# Patient Record
Sex: Female | Born: 1965 | Race: White | Hispanic: No | Marital: Married | Smoking: Never smoker
Health system: Southern US, Community
[De-identification: ages and names within clinical notes are randomized; demographics above are authoritative.]

## PROBLEM LIST (undated history)

## (undated) DIAGNOSIS — H8110 Benign paroxysmal vertigo, unspecified ear: Secondary | ICD-10-CM

## (undated) DIAGNOSIS — I10 Essential (primary) hypertension: Secondary | ICD-10-CM

## (undated) DIAGNOSIS — M7702 Medial epicondylitis, left elbow: Secondary | ICD-10-CM

## (undated) DIAGNOSIS — H3530 Unspecified macular degeneration: Secondary | ICD-10-CM

## (undated) DIAGNOSIS — N200 Calculus of kidney: Secondary | ICD-10-CM

## (undated) DIAGNOSIS — M1990 Unspecified osteoarthritis, unspecified site: Secondary | ICD-10-CM

## (undated) DIAGNOSIS — F419 Anxiety disorder, unspecified: Secondary | ICD-10-CM

## (undated) DIAGNOSIS — F329 Major depressive disorder, single episode, unspecified: Secondary | ICD-10-CM

## (undated) DIAGNOSIS — G5761 Lesion of plantar nerve, right lower limb: Secondary | ICD-10-CM

## (undated) DIAGNOSIS — N959 Unspecified menopausal and perimenopausal disorder: Secondary | ICD-10-CM

## (undated) DIAGNOSIS — E785 Hyperlipidemia, unspecified: Secondary | ICD-10-CM

## (undated) DIAGNOSIS — G4730 Sleep apnea, unspecified: Secondary | ICD-10-CM

## (undated) HISTORY — PX: ESOPHAGOGASTRODUODENOSCOPY: SHX1529

## (undated) HISTORY — DX: Unspecified osteoarthritis, unspecified site: M19.90

## (undated) HISTORY — DX: Anxiety disorder, unspecified: F41.9

## (undated) HISTORY — DX: Calculus of kidney: N20.0

## (undated) HISTORY — DX: Major depressive disorder, single episode, unspecified: F32.9

## (undated) HISTORY — DX: Unspecified menopausal and perimenopausal disorder: N95.9

## (undated) HISTORY — DX: Medial epicondylitis, left elbow: M77.02

## (undated) HISTORY — DX: Essential (primary) hypertension: I10

## (undated) HISTORY — DX: Benign paroxysmal vertigo, unspecified ear: H81.10

## (undated) HISTORY — DX: Lesion of plantar nerve, right lower limb: G57.61

## (undated) HISTORY — DX: Hyperlipidemia, unspecified: E78.5

---

## 2013-03-01 ENCOUNTER — Ambulatory Visit: Payer: Self-pay | Admitting: Family Medicine

## 2014-05-23 ENCOUNTER — Ambulatory Visit: Payer: Self-pay | Admitting: Family Medicine

## 2015-06-04 ENCOUNTER — Other Ambulatory Visit: Payer: Self-pay | Admitting: Family Medicine

## 2015-06-04 DIAGNOSIS — Z1231 Encounter for screening mammogram for malignant neoplasm of breast: Secondary | ICD-10-CM

## 2015-06-06 ENCOUNTER — Ambulatory Visit
Admission: RE | Admit: 2015-06-06 | Discharge: 2015-06-06 | Disposition: A | Discharge status: Home / Self Care | Payer: No Typology Code available for payment source | Source: Ambulatory Visit | Attending: Family Medicine | Admitting: Family Medicine

## 2015-06-06 DIAGNOSIS — Z1231 Encounter for screening mammogram for malignant neoplasm of breast: Secondary | ICD-10-CM | POA: Insufficient documentation

## 2015-11-13 ENCOUNTER — Ambulatory Visit (INDEPENDENT_AMBULATORY_CARE_PROVIDER_SITE_OTHER): Payer: BLUE CROSS/BLUE SHIELD | Admitting: Family Medicine

## 2015-11-13 ENCOUNTER — Encounter: Payer: Self-pay | Admitting: Family Medicine

## 2015-11-13 VITALS — BP 127/87 | HR 77 | Temp 98.0°F | Ht 58.5 in | Wt 169.0 lb

## 2015-11-13 DIAGNOSIS — M7700 Medial epicondylitis, unspecified elbow: Secondary | ICD-10-CM | POA: Diagnosis not present

## 2015-11-13 DIAGNOSIS — J0100 Acute maxillary sinusitis, unspecified: Secondary | ICD-10-CM | POA: Diagnosis not present

## 2015-11-13 DIAGNOSIS — M7702 Medial epicondylitis, left elbow: Secondary | ICD-10-CM

## 2015-11-13 DIAGNOSIS — E785 Hyperlipidemia, unspecified: Secondary | ICD-10-CM

## 2015-11-13 DIAGNOSIS — E1169 Type 2 diabetes mellitus with other specified complication: Secondary | ICD-10-CM | POA: Insufficient documentation

## 2015-11-13 DIAGNOSIS — E1159 Type 2 diabetes mellitus with other circulatory complications: Secondary | ICD-10-CM | POA: Insufficient documentation

## 2015-11-13 DIAGNOSIS — Z5181 Encounter for therapeutic drug level monitoring: Secondary | ICD-10-CM

## 2015-11-13 DIAGNOSIS — I152 Hypertension secondary to endocrine disorders: Secondary | ICD-10-CM | POA: Insufficient documentation

## 2015-11-13 DIAGNOSIS — I10 Essential (primary) hypertension: Secondary | ICD-10-CM

## 2015-11-13 MED ORDER — FLUCONAZOLE 150 MG PO TABS: 150.0000 mg | ORAL_TABLET | Freq: Once | ORAL | Status: DC

## 2015-11-13 MED ORDER — AMOXICILLIN-POT CLAVULANATE 875-125 MG PO TABS
1.0000 | ORAL_TABLET | Freq: Two times a day (BID) | ORAL | Status: AC
Start: 2015-11-13 — End: 2015-11-22

## 2015-11-13 NOTE — Assessment & Plan Note (Signed)
Check lipids 

## 2015-11-13 NOTE — Assessment & Plan Note (Signed)
Open invitation so she can call for a referral to ortho if needed; may use NSAID per package directions

## 2015-11-13 NOTE — Assessment & Plan Note (Signed)
Offered to let her try to get through this without antibiotics, but she has been sick long enough (a few weeks), ready to try antibiotics; discussed risk of C diff; also gave fluconazole in case needed; see AVS

## 2015-11-13 NOTE — Progress Notes (Signed)
BP 127/87 mmHg  Pulse 77  Temp(Src) 98 F (36.7 C)  Ht 4' 10.5" (1.486 m)  Wt 169 lb (76.658 kg)  BMI 34.72 kg/m2  SpO2 97%   Subjective:    Patient ID: Marissa Baldwin, female    DOB: May 31, 1966, 50 y.o.   MRN: [id number]  HPI: Marissa Baldwin is a 50 y.o. female  Chief Complaint  Patient presents with  . Establish Care  . Sinusitis    she saw urgent care a few weeks ago and got an antibiotic, but feels like it's not resolved.    She thinks has a sinus infection; started at the end of December; not sure; she went to urgent care last summer, he gave her an antibiotic but that in the summer; had a fever a few weeks ago; no fever now; ears are like water in them; no rash, no travel, no sore throat; blew out yellowish stuff from her nose  She had left elbow tendonitis, runs down the arm; saw Dr. Dema Severin; going on for 3 months; sometimes she doesn't even feel, but sometimes it wakes her up at night; sore to the touch; not interested in seeing specialist right now; trying NSAID and ice  Using venlafaxine for hot flashes; last period 3 years ago; had a complete physical in the summer at another clinic  High cholesterol, has not been checked for a while; last check 6-8 months ago  Has high blood pressure; on two medicines  Relevant past medical, surgical, family and social history reviewed and updated as indicated. Interim medical history since our last visit reviewed. Allergies and medications reviewed and updated.  Review of Systems Per HPI unless specifically indicated above     Objective:    BP 127/87 mmHg  Pulse 77  Temp(Src) 98 F (36.7 C)  Ht 4' 10.5" (1.486 m)  Wt 169 lb (76.658 kg)  BMI 34.72 kg/m2  SpO2 97%  Wt Readings from Last 3 Encounters:  11/13/15 169 lb (76.658 kg)    Physical Exam  Constitutional: She appears well-developed and well-nourished. No distress.  HENT:  Head: Normocephalic and atraumatic.  Right Ear: Hearing, tympanic membrane, external ear and ear  canal normal. Tympanic membrane is not erythematous. No middle ear effusion.  Left Ear: Hearing, tympanic membrane, external ear and ear canal normal. Tympanic membrane is not erythematous.  No middle ear effusion.  Nose: Mucosal edema and rhinorrhea (yellowish drainage, right > left) present.  Mouth/Throat: Mucous membranes are normal. Posterior oropharyngeal erythema: mild injection posteriorly.  Eyes: EOM are normal. No scleral icterus.  Neck: No thyromegaly present.  Cardiovascular: Normal rate, regular rhythm and normal heart sounds.   No murmur heard. Pulmonary/Chest: Effort normal and breath sounds normal. No respiratory distress. She has no wheezes.  Abdominal: Soft. Bowel sounds are normal. She exhibits no distension.  Musculoskeletal: Normal range of motion. She exhibits no edema.       Left forearm: She exhibits tenderness (mild over medial epicondyle). She exhibits no swelling, no edema and no deformity.  Lymphadenopathy:    She has no cervical adenopathy.  Neurological: She is alert. She exhibits normal muscle tone.  Strength in UE 5/5 except for slight breakthrough with thumb to pinky on the left  Skin: Skin is warm and dry. No rash noted. She is not diaphoretic. No pallor.  Psychiatric: She has a normal mood and affect. Her behavior is normal. Judgment and thought content normal.    No results found for this or any previous  visit.    Assessment & Plan:   Problem List Items Addressed This Visit      Cardiovascular and Mediastinum   Essential hypertension, benign    Continue regimen; try DASH guidelines; see AVS; check creatinine      Relevant Medications   amLODipine (NORVASC) 5 MG tablet   losartan (COZAAR) 50 MG tablet     Respiratory   Acute maxillary sinusitis - Primary    Offered to let her try to get through this without antibiotics, but she has been sick long enough (a few weeks), ready to try antibiotics; discussed risk of C diff; also gave fluconazole in  case needed; see AVS      Relevant Medications   amoxicillin-clavulanate (AUGMENTIN) 875-125 MG tablet   fluconazole (DIFLUCAN) 150 MG tablet     Musculoskeletal and Integument   Golfers elbow of left upper extremity    Open invitation so she can call for a referral to ortho if needed; may use NSAID per package directions      Relevant Medications   acetaminophen (TYLENOL) 325 MG tablet   naproxen sodium (RA NAPROXEN SODIUM) 220 MG tablet     Other   Hyperlipidemia    Check lipids      Relevant Medications   amLODipine (NORVASC) 5 MG tablet   losartan (COZAAR) 50 MG tablet   Other Relevant Orders   Lipid Panel w/o Chol/HDL Ratio   Medication monitoring encounter    Check CBC and CMP on current meds      Relevant Orders   Comprehensive metabolic panel   CBC with Differential/Platelet       Follow up plan: Return in about 6 months (around 05/12/2016) for complete physical.  Orders Placed This Encounter  Procedures  . Lipid Panel w/o Chol/HDL Ratio  . Comprehensive metabolic panel  . CBC with Differential/Platelet   Meds ordered this encounter  Medications  . acetaminophen (TYLENOL) 325 MG tablet    Sig: Take 650 mg by mouth every 4 (four) hours as needed.  [name] Kitchen amLODipine (NORVASC) 5 MG tablet    Sig: Take 5 mg by mouth daily.  [name] Kitchen losartan (COZAAR) 50 MG tablet    Sig: Take 50 mg by mouth daily.  . meclizine (ANTIVERT) 25 MG tablet    Sig: Take 25 mg by mouth 3 (three) times daily as needed.  . naproxen sodium (RA NAPROXEN SODIUM) 220 MG tablet    Sig: Take 220 mg by mouth 2 (two) times daily.  [name] Kitchen venlafaxine XR (EFFEXOR-XR) 75 MG 24 hr capsule    Sig: Take 75 mg by mouth daily.  [name] Kitchen amoxicillin-clavulanate (AUGMENTIN) 875-125 MG tablet    Sig: Take 1 tablet by mouth 2 (two) times daily.    Dispense:  20 tablet    Refill:  0  . fluconazole (DIFLUCAN) 150 MG tablet    Sig: Take 1 tablet (150 mg total) by mouth once.    Dispense:  1 tablet    Refill:  0   An  after-visit summary was printed and given to the patient at [place].  Please see the patient instructions which may contain other information and recommendations beyond what is mentioned above in the assessment and plan.

## 2015-11-13 NOTE — Assessment & Plan Note (Signed)
Check CBC and CMP on current meds

## 2015-11-13 NOTE — Assessment & Plan Note (Signed)
Continue regimen; try DASH guidelines; see AVS; check creatinine

## 2015-11-13 NOTE — Patient Instructions (Addendum)
Please do eat yogurt daily or take a probiotic daily for the next month or two We want to replace the healthy germs in the gut If you notice foul, watery diarrhea in the next two months, schedule an appointment RIGHT AWAY Start the antibiotics Use the fluconazole if needed If your elbow becomes too bothersome, let me know and we'll refer you to an orthopaedist  Your goal blood pressure is less than 140 mmHg on top and under 90 on the bottom Try to follow the DASH guidelines (DASH stands for Dietary Approaches to Stop Hypertension) Try to limit the sodium in your diet.  Ideally, consume less than 1.5 grams (less than 1,500mg ) per day. Do not add salt when cooking or at the table.  Check the sodium amount on labels when shopping, and choose items lower in sodium when given a choice. Avoid or limit foods that already contain a lot of sodium. Eat a diet rich in fruits and vegetables and whole grains.    DASH Eating Plan DASH stands for "Dietary Approaches to Stop Hypertension." The DASH eating plan is a healthy eating plan that has been shown to reduce high blood pressure (hypertension). Additional health benefits may include reducing the risk of type 2 diabetes mellitus, heart disease, and stroke. The DASH eating plan may also help with weight loss. WHAT DO I NEED TO KNOW ABOUT THE DASH EATING PLAN? For the DASH eating plan, you will follow these general guidelines:  Choose foods with a percent daily value for sodium of less than 5% (as listed on the food label).  Use salt-free seasonings or herbs instead of table salt or sea salt.  Check with your health care provider or pharmacist before using salt substitutes.  Eat lower-sodium products, often labeled as "lower sodium" or "no salt added."  Eat fresh foods.  Eat more vegetables, fruits, and low-fat dairy products.  Choose whole grains. Look for the word "whole" as the first word in the ingredient list.  Choose fish and skinless  chicken or [place] more often than red meat. Limit fish, poultry, and meat to 6 oz (170 g) each day.  Limit sweets, desserts, sugars, and sugary drinks.  Choose heart-healthy fats.  Limit cheese to 1 oz (28 g) per day.  Eat more home-cooked food and less restaurant, buffet, and fast food.  Limit fried foods.  Cook foods using methods other than frying.  Limit canned vegetables. If you do use them, rinse them well to decrease the sodium.  When eating at a restaurant, ask that your food be prepared with less salt, or no salt if possible. WHAT FOODS CAN I EAT? Seek help from a dietitian for individual calorie needs. Grains Whole grain or whole wheat bread. Brown rice. Whole grain or whole wheat pasta. Quinoa, bulgur, and whole grain cereals. Low-sodium cereals. Corn or whole wheat flour tortillas. Whole grain cornbread. Whole grain crackers. Low-sodium crackers. Vegetables Fresh or frozen vegetables (raw, steamed, roasted, or grilled). Low-sodium or reduced-sodium tomato and vegetable juices. Low-sodium or reduced-sodium tomato sauce and paste. Low-sodium or reduced-sodium canned vegetables.  Fruits All fresh, canned (in natural juice), or frozen fruits. Meat and Other Protein Products Ground beef (85% or leaner), grass-fed beef, or beef trimmed of fat. Skinless chicken or [place]. Ground chicken or [place]. Pork trimmed of fat. All fish and seafood. Eggs. Dried beans, peas, or lentils. Unsalted nuts and seeds. Unsalted canned beans. Dairy Low-fat dairy products, such as skim or 1% milk, 2% or reduced-fat cheeses, low-fat  ricotta or cottage cheese, or plain low-fat yogurt. Low-sodium or reduced-sodium cheeses. Fats and Oils Tub margarines without trans fats. Light or reduced-fat mayonnaise and salad dressings (reduced sodium). Avocado. Safflower, olive, or canola oils. Natural peanut or almond butter. Other Unsalted popcorn and pretzels. The items listed above may not be a complete list of  recommended foods or beverages. Contact your dietitian for more options. WHAT FOODS ARE NOT RECOMMENDED? Grains White bread. White pasta. White rice. Refined cornbread. Bagels and croissants. Crackers that contain trans fat. Vegetables Creamed or fried vegetables. Vegetables in a cheese sauce. Regular canned vegetables. Regular canned tomato sauce and paste. Regular tomato and vegetable juices. Fruits Dried fruits. Canned fruit in light or heavy syrup. Fruit juice. Meat and Other Protein Products Fatty cuts of meat. Ribs, chicken wings, bacon, sausage, bologna, salami, chitterlings, fatback, hot dogs, bratwurst, and packaged luncheon meats. Salted nuts and seeds. Canned beans with salt. Dairy Whole or 2% milk, cream, half-and-half, and cream cheese. Whole-fat or sweetened yogurt. Full-fat cheeses or blue cheese. Nondairy creamers and whipped toppings. Processed cheese, cheese spreads, or cheese curds. Condiments Onion and garlic salt, seasoned salt, table salt, and sea salt. Canned and packaged gravies. Worcestershire sauce. Tartar sauce. Barbecue sauce. Teriyaki sauce. Soy sauce, including reduced sodium. Steak sauce. Fish sauce. Oyster sauce. Cocktail sauce. Horseradish. Ketchup and mustard. Meat flavorings and tenderizers. Bouillon cubes. Hot sauce. Tabasco sauce. Marinades. Taco seasonings. Relishes. Fats and Oils Butter, stick margarine, lard, shortening, ghee, and bacon fat. Coconut, palm kernel, or palm oils. Regular salad dressings. Other Pickles and olives. Salted popcorn and pretzels. The items listed above may not be a complete list of foods and beverages to avoid. Contact your dietitian for more information. WHERE CAN I FIND MORE INFORMATION? National Heart, Lung, and Blood Institute: travelstabloid.com   This information is not intended to replace advice given to you by your health care provider. Make sure you discuss any questions you have with  your health care provider.   Document Released: 09/24/2011 Document Revised: 10/26/2014 Document Reviewed: 08/09/2013 Elsevier Interactive Patient Education 2016 [employer]. Sinusitis, Adult Sinusitis is redness, soreness, and puffiness (inflammation) of the air pockets in the bones of your face (sinuses). The redness, soreness, and puffiness can cause air and mucus to get trapped in your sinuses. This can allow germs to grow and cause an infection.  HOME CARE   Drink enough fluids to keep your pee (urine) clear or pale yellow.  Use a humidifier in your home.  Run a hot shower to create steam in the bathroom. Sit in the bathroom with the door closed. Breathe in the steam 3-4 times a day.  Put a warm, moist washcloth on your face 3-4 times a day, or as told by your doctor.  Use salt water sprays (saline sprays) to wet the thick fluid in your nose. This can help the sinuses drain.  Only take medicine as told by your doctor. GET HELP RIGHT AWAY IF:   Your pain gets worse.  You have very bad headaches.  You are sick to your stomach (nauseous).  You throw up (vomit).  You are very sleepy (drowsy) all the time.  Your face is puffy (swollen).  Your vision changes.  You have a stiff neck.  You have trouble breathing. MAKE SURE YOU:   Understand these instructions.  Will watch your condition.  Will get help right away if you are not doing well or get worse.   This information is not intended to  replace advice given to you by your health care provider. Make sure you discuss any questions you have with your health care provider.   Document Released: 03/23/2008 Document Revised: 10/26/2014 Document Reviewed: 05/10/2012 Elsevier Interactive Patient Education [employer].

## 2015-11-14 LAB — CBC WITH DIFFERENTIAL/PLATELET
Basophils Absolute: 0 10*3/uL (ref 0.0–0.2)
Basos: 1 %
EOS (ABSOLUTE): 0 10*3/uL (ref 0.0–0.4)
EOS: 0 %
Hematocrit: 43.8 % (ref 34.0–46.6)
Hemoglobin: 14.5 g/dL (ref 11.1–15.9)
IMMATURE GRANULOCYTES: 0 %
Immature Grans (Abs): 0 10*3/uL (ref 0.0–0.1)
Lymphocytes Absolute: 2 10*3/uL (ref 0.7–3.1)
Lymphs: 33 %
MCH: 29.1 pg (ref 26.6–33.0)
MCHC: 33.1 g/dL (ref 31.5–35.7)
MCV: 88 fL (ref 79–97)
MONOS ABS: 0.3 10*3/uL (ref 0.1–0.9)
Monocytes: 5 %
NEUTROS PCT: 61 %
Neutrophils Absolute: 3.8 10*3/uL (ref 1.4–7.0)
PLATELETS: 394 10*3/uL — AB (ref 150–379)
RBC: 4.99 x10E6/uL (ref 3.77–5.28)
RDW: 13.8 % (ref 12.3–15.4)
WBC: 6.1 10*3/uL (ref 3.4–10.8)

## 2015-11-14 LAB — COMPREHENSIVE METABOLIC PANEL
ALK PHOS: 135 IU/L — AB (ref 39–117)
ALT: 67 IU/L — AB (ref 0–32)
AST: 42 IU/L — AB (ref 0–40)
Albumin/Globulin Ratio: 1.7 (ref 1.1–2.5)
Albumin: 4.8 g/dL (ref 3.5–5.5)
BUN/Creatinine Ratio: 18 (ref 9–23)
BUN: 12 mg/dL (ref 6–24)
Bilirubin Total: 0.4 mg/dL (ref 0.0–1.2)
CO2: 23 mmol/L (ref 18–29)
CREATININE: 0.67 mg/dL (ref 0.57–1.00)
Calcium: 10 mg/dL (ref 8.7–10.2)
Chloride: 98 mmol/L (ref 96–106)
GFR calc Af Amer: 119 mL/min/{1.73_m2} (ref >59)
GFR calc non Af Amer: 104 mL/min/{1.73_m2} (ref >59)
GLOBULIN, TOTAL: 2.8 g/dL (ref 1.5–4.5)
Glucose: 110 mg/dL — ABNORMAL HIGH (ref 65–99)
POTASSIUM: 4.6 mmol/L (ref 3.5–5.2)
SODIUM: 139 mmol/L (ref 134–144)
Total Protein: 7.6 g/dL (ref 6.0–8.5)

## 2015-11-14 LAB — LIPID PANEL W/O CHOL/HDL RATIO
Cholesterol, Total: 249 mg/dL — ABNORMAL HIGH (ref 100–199)
HDL: 59 mg/dL (ref >39)
LDL CALC: 170 mg/dL — AB (ref 0–99)
Triglycerides: 98 mg/dL (ref 0–149)
VLDL CHOLESTEROL CAL: 20 mg/dL (ref 5–40)

## 2015-11-16 ENCOUNTER — Telehealth: Payer: Self-pay | Admitting: Family Medicine

## 2015-11-16 DIAGNOSIS — R7401 Elevation of levels of liver transaminase levels: Secondary | ICD-10-CM

## 2015-11-16 DIAGNOSIS — E785 Hyperlipidemia, unspecified: Secondary | ICD-10-CM

## 2015-11-16 DIAGNOSIS — R748 Abnormal levels of other serum enzymes: Secondary | ICD-10-CM | POA: Insufficient documentation

## 2015-11-16 DIAGNOSIS — R740 Nonspecific elevation of levels of transaminase and lactic acid dehydrogenase [LDH]: Secondary | ICD-10-CM

## 2015-11-16 DIAGNOSIS — D473 Essential (hemorrhagic) thrombocythemia: Secondary | ICD-10-CM

## 2015-11-16 DIAGNOSIS — D75839 Thrombocytosis, unspecified: Secondary | ICD-10-CM

## 2015-11-16 NOTE — Telephone Encounter (Signed)
Reached voicemail, left brief msg, calling about labs, don't be alarmed that I'm calling a Saturday, just my chance to catch up on calls, will call on Monday

## 2015-11-18 NOTE — Telephone Encounter (Signed)
Lab notified to add on. 

## 2015-11-18 NOTE — Assessment & Plan Note (Signed)
Consider Welchol or Zetia after checking on liver; encouraged lowfat diet, low sat fats

## 2015-11-18 NOTE — Assessment & Plan Note (Signed)
Discussed with patient, order liver US; Thursday or first thing in the AM

## 2015-11-18 NOTE — Telephone Encounter (Signed)
Discussed labs with patient; she cannot tolerate statins; will consider welchol or zetia after liver testing Explained elevated liver tests; check additional labs and get liver US; return in one month for recheck labs (hepatic function panel) ------------------------ Jossie -- please run ADD-ON labs for patient, labs drawn on the 25th

## 2015-11-20 LAB — SPECIMEN STATUS REPORT

## 2015-11-20 LAB — HEPATITIS PANEL, ACUTE
HEP B S AG: NEGATIVE
Hep A IgM: NEGATIVE
Hep B C IgM: NEGATIVE
Hep C Virus Ab: <0.1 s/co ratio (ref 0.0–0.9)

## 2015-11-20 LAB — GAMMA GT: GGT: 180 IU/L — ABNORMAL HIGH (ref 0–60)

## 2015-11-21 ENCOUNTER — Other Ambulatory Visit: Payer: Self-pay | Admitting: Family Medicine

## 2015-11-21 DIAGNOSIS — R740 Nonspecific elevation of levels of transaminase and lactic acid dehydrogenase [LDH]: Secondary | ICD-10-CM

## 2015-11-21 DIAGNOSIS — R7401 Elevation of levels of liver transaminase levels: Secondary | ICD-10-CM

## 2015-11-21 DIAGNOSIS — R748 Abnormal levels of other serum enzymes: Secondary | ICD-10-CM

## 2015-11-26 ENCOUNTER — Ambulatory Visit
Admission: RE | Admit: 2015-11-26 | Discharge: 2015-11-26 | Disposition: A | Discharge status: Home / Self Care | Payer: BLUE CROSS/BLUE SHIELD | Source: Ambulatory Visit | Attending: Family Medicine | Admitting: Family Medicine

## 2015-11-26 ENCOUNTER — Telehealth: Payer: Self-pay | Admitting: Family Medicine

## 2015-11-26 DIAGNOSIS — R748 Abnormal levels of other serum enzymes: Secondary | ICD-10-CM

## 2015-11-26 DIAGNOSIS — R7401 Elevation of levels of liver transaminase levels: Secondary | ICD-10-CM

## 2015-11-26 DIAGNOSIS — R740 Nonspecific elevation of levels of transaminase and lactic acid dehydrogenase [LDH]: Secondary | ICD-10-CM | POA: Insufficient documentation

## 2015-11-26 DIAGNOSIS — K760 Fatty (change of) liver, not elsewhere classified: Secondary | ICD-10-CM | POA: Diagnosis not present

## 2015-11-26 NOTE — Telephone Encounter (Signed)
I talked with patient about labs and [place] She was taking tylenol, but just two a day She does drink alcohol, not every day, not constantly; not even 7 drinks a week, 3 per week is more like it We talked about fatty liver, low fat diet; recheck labs next week She'll come in on February 14th at 8:15 and we'll do in-house SGOT and SGPT and send out hepatic function panel Avoid vinegar diet; briefly discussed; she has hx of ulcer

## 2015-11-26 NOTE — Assessment & Plan Note (Signed)
Recheck next week

## 2015-11-26 NOTE — Assessment & Plan Note (Signed)
Recheck labs; liver US showed fatty liver

## 2015-12-03 ENCOUNTER — Other Ambulatory Visit: Payer: BLUE CROSS/BLUE SHIELD

## 2015-12-03 DIAGNOSIS — R740 Nonspecific elevation of levels of transaminase and lactic acid dehydrogenase [LDH]: Principal | ICD-10-CM

## 2015-12-03 DIAGNOSIS — R7401 Elevation of levels of liver transaminase levels: Secondary | ICD-10-CM

## 2015-12-03 LAB — AST (SGOT) PICCOLO, WAIVED: AST (SGOT) PICCOLO, WAIVED: 35 U/L (ref 11–38)

## 2015-12-03 LAB — ALT (SGPT) PICCOLO, WAIVED: ALT (SGPT) Piccolo, Waived: 40 U/L (ref 10–47)

## 2015-12-04 LAB — HEPATIC FUNCTION PANEL
ALT: 36 IU/L — AB (ref 0–32)
AST: 27 IU/L (ref 0–40)
Albumin: 4.3 g/dL (ref 3.5–5.5)
Alkaline Phosphatase: 121 IU/L — ABNORMAL HIGH (ref 39–117)
BILIRUBIN TOTAL: 0.5 mg/dL (ref 0.0–1.2)
BILIRUBIN, DIRECT: 0.12 mg/dL (ref 0.00–0.40)
Total Protein: 6.8 g/dL (ref 6.0–8.5)

## 2015-12-11 ENCOUNTER — Other Ambulatory Visit: Payer: Self-pay | Admitting: Family Medicine

## 2015-12-12 MED ORDER — FLUCONAZOLE 150 MG PO TABS: 150.0000 mg | ORAL_TABLET | Freq: Once | ORAL | Status: DC

## 2016-01-22 DIAGNOSIS — H353131 Nonexudative age-related macular degeneration, bilateral, early dry stage: Secondary | ICD-10-CM | POA: Diagnosis not present

## 2016-04-13 ENCOUNTER — Encounter: Payer: Self-pay | Admitting: Family Medicine

## 2016-05-14 ENCOUNTER — Ambulatory Visit (INDEPENDENT_AMBULATORY_CARE_PROVIDER_SITE_OTHER): Payer: BLUE CROSS/BLUE SHIELD | Admitting: Family Medicine

## 2016-05-14 ENCOUNTER — Encounter: Payer: Self-pay | Admitting: Family Medicine

## 2016-05-14 DIAGNOSIS — E669 Obesity, unspecified: Secondary | ICD-10-CM | POA: Diagnosis not present

## 2016-05-14 DIAGNOSIS — R0681 Apnea, not elsewhere classified: Secondary | ICD-10-CM | POA: Diagnosis not present

## 2016-05-14 DIAGNOSIS — E785 Hyperlipidemia, unspecified: Secondary | ICD-10-CM

## 2016-05-14 DIAGNOSIS — G4730 Sleep apnea, unspecified: Secondary | ICD-10-CM | POA: Insufficient documentation

## 2016-05-14 DIAGNOSIS — D473 Essential (hemorrhagic) thrombocythemia: Secondary | ICD-10-CM

## 2016-05-14 DIAGNOSIS — I10 Essential (primary) hypertension: Secondary | ICD-10-CM | POA: Diagnosis not present

## 2016-05-14 DIAGNOSIS — D75839 Thrombocytosis, unspecified: Secondary | ICD-10-CM

## 2016-05-14 DIAGNOSIS — Z5181 Encounter for therapeutic drug level monitoring: Secondary | ICD-10-CM | POA: Diagnosis not present

## 2016-05-14 LAB — COMPREHENSIVE METABOLIC PANEL
ALK PHOS: 149 U/L — AB (ref 33–130)
ALT: 93 U/L — AB (ref 6–29)
AST: 61 U/L — ABNORMAL HIGH (ref 10–35)
Albumin: 4.5 g/dL (ref 3.6–5.1)
BUN: 18 mg/dL (ref 7–25)
CALCIUM: 9.6 mg/dL (ref 8.6–10.4)
CHLORIDE: 104 mmol/L (ref 98–110)
CO2: 23 mmol/L (ref 20–31)
Creat: 0.62 mg/dL (ref 0.50–1.05)
GLUCOSE: 179 mg/dL — AB (ref 65–99)
POTASSIUM: 4.2 mmol/L (ref 3.5–5.3)
Sodium: 140 mmol/L (ref 135–146)
TOTAL PROTEIN: 7.5 g/dL (ref 6.1–8.1)
Total Bilirubin: 0.6 mg/dL (ref 0.2–1.2)

## 2016-05-14 LAB — CBC WITH DIFFERENTIAL/PLATELET
BASOS ABS: 0 {cells}/uL (ref 0–200)
BASOS PCT: 0 %
EOS ABS: 0 {cells}/uL — AB (ref 15–500)
Eosinophils Relative: 0 %
HEMATOCRIT: 42.6 % (ref 35.0–45.0)
Hemoglobin: 14.5 g/dL (ref 11.7–15.5)
LYMPHS PCT: 30 %
Lymphs Abs: 1500 cells/uL (ref 850–3900)
MCH: 30 pg (ref 27.0–33.0)
MCHC: 34 g/dL (ref 32.0–36.0)
MCV: 88.2 fL (ref 80.0–100.0)
MONO ABS: 300 {cells}/uL (ref 200–950)
MPV: 10.5 fL (ref 7.5–12.5)
Monocytes Relative: 6 %
Neutro Abs: 3200 cells/uL (ref 1500–7800)
Neutrophils Relative %: 64 %
Platelets: 333 10*3/uL (ref 140–400)
RBC: 4.83 MIL/uL (ref 3.80–5.10)
RDW: 13.3 % (ref 11.0–15.0)
WBC: 5 10*3/uL (ref 3.8–10.8)

## 2016-05-14 LAB — LIPID PANEL
CHOLESTEROL: 247 mg/dL — AB (ref 125–200)
HDL: 52 mg/dL (ref >=46)
LDL Cholesterol: 175 mg/dL — ABNORMAL HIGH (ref <130)
TRIGLYCERIDES: 99 mg/dL (ref <150)
Total CHOL/HDL Ratio: 4.8 Ratio (ref <=5.0)
VLDL: 20 mg/dL (ref <30)

## 2016-05-14 LAB — TSH: TSH: 0.84 mIU/L

## 2016-05-14 NOTE — Assessment & Plan Note (Signed)
See AVS, check TSH today

## 2016-05-14 NOTE — Assessment & Plan Note (Signed)
DASH guidelines and weight loss encouraged; check creatinine

## 2016-05-14 NOTE — Assessment & Plan Note (Signed)
Check CBC 

## 2016-05-14 NOTE — Assessment & Plan Note (Signed)
- 

## 2016-05-14 NOTE — Patient Instructions (Addendum)
Check out the information at familydoctor.org entitled "Nutrition for Weight Loss: What You Need to Know about Fad Diets" Try to lose between 1-2 pounds per week by taking in fewer calories and burning off more calories You can succeed by limiting portions, limiting foods dense in calories and fat, becoming more active, and drinking 8 glasses of water a day (64 ounces) Don't skip meals, especially breakfast, as skipping meals may alter your metabolism Do not use over-the-counter weight loss pills or gimmicks that claim rapid weight loss A healthy BMI (or body mass index) is between 18.5 and 24.9 You can calculate your ideal BMI at the NIH website ClubMonetize.fr  We'll refer you to a pulmonologist for the possible sleep apnea  Try to limit saturated fats in your diet (bologna, hot dogs, barbeque, cheeseburgers, hamburgers, steak, bacon, sausage, cheese, etc.) and get more fresh fruits, vegetables, and whole grains  Your goal blood pressure is less than 140 mmHg on top. Try to follow the DASH guidelines (DASH stands for Dietary Approaches to Stop Hypertension) Try to limit the sodium in your diet.  Ideally, consume less than 1.5 grams (less than 1,500mg ) per day. Do not add salt when cooking or at the table.  Check the sodium amount on labels when shopping, and choose items lower in sodium when given a choice. Avoid or limit foods that already contain a lot of sodium. Eat a diet rich in fruits and vegetables and whole grains.

## 2016-05-14 NOTE — Progress Notes (Signed)
BP 122/86   Pulse 77   Temp 98.4 F (36.9 C) (Oral)   Resp 14   Wt 171 lb (77.6 kg)   SpO2 93%   BMI 35.13 kg/m    Subjective:    Patient ID: Marissa Baldwin, female    DOB: [birth year], 50 y.o.   MRN: [id number]  HPI: JERLINE LUDWIG is a 50 y.o. female  Chief Complaint  Patient presents with  . Follow-up    6 months   Here for six month follow-up She tries to eat right but sometimes it just doesn't happen; she walks and gets in the pool Her husband says she snores really bad; she stops breathing and wakes herself up sometimes Obesity; tries to drink water; drinks sodas but can't stop that, regular sodas Using primrose for hot flashes, also venlafaxine HTN; controlled; does not cook with salt, adds a little salt to her food; hands and feet get swollen sometimes High cholesterol; she cut back on her cheese because that was part of the problems; occasional bacon Hx of elevated LFTs; no abd pain, no jaundice  Depression screen PHQ 2/9 05/14/2016  Decreased Interest 0  Down, Depressed, Hopeless 0  PHQ - 2 Score 0   Relevant past medical, surgical, family and social history reviewed Past Medical History:  Diagnosis Date  . BPPV (benign paroxysmal positional vertigo)   . Golfers elbow of left upper extremity   . Hyperlipidemia   . Hypertension   . Morton's neuroma of right foot   . Postmenopausal symptoms    History reviewed. No pertinent surgical history. Family History  Problem Relation Age of Onset  . Diabetes Father   . Hypertension Father   . Heart disease Father   . Stroke Father     mini strokes  . Cancer Maternal Aunt     rectal  . Hypertension Maternal Grandmother   . Diabetes Maternal Grandfather   . Hypertension Paternal Grandmother   . Heart disease Paternal Grandfather   . COPD Neg Hx    Social History  Substance Use Topics  . Smoking status: Never Smoker  . Smokeless tobacco: Never Used  . Alcohol use Yes     Comment: occasional    Interim medical  history since last visit reviewed. Allergies and medications reviewed  Review of Systems Per HPI unless specifically indicated above     Objective:    BP 122/86   Pulse 77   Temp 98.4 F (36.9 C) (Oral)   Resp 14   Wt 171 lb (77.6 kg)   SpO2 93%   BMI 35.13 kg/m   Wt Readings from Last 3 Encounters:  05/14/16 171 lb (77.6 kg)  11/13/15 169 lb (76.7 kg)    Physical Exam  Constitutional: She appears well-developed and well-nourished. No distress.  obese  HENT:  Head: Normocephalic and atraumatic.  Eyes: EOM are normal. No scleral icterus.  Neck: No thyromegaly present.  Cardiovascular: Normal rate, regular rhythm and normal heart sounds.   No murmur heard. Pulmonary/Chest: Effort normal and breath sounds normal. No respiratory distress. She has no wheezes.  Abdominal: Soft. Bowel sounds are normal. She exhibits no distension.  Musculoskeletal: Normal range of motion. She exhibits no edema.  Neurological: She is alert. She exhibits normal muscle tone.  Skin: Skin is warm and dry. She is not diaphoretic. No pallor.  Psychiatric: She has a normal mood and affect. Her behavior is normal. Judgment and thought content normal.   Results for orders placed  or performed in visit on 12/03/15  Hepatic function panel  Result Value Ref Range   Total Protein 6.8 6.0 - 8.5 g/dL   Albumin 4.3 3.5 - 5.5 g/dL   Bilirubin Total 0.5 0.0 - 1.2 mg/dL   Bilirubin, Direct 0.12 0.00 - 0.40 mg/dL   Alkaline Phosphatase 121 (H) 39 - 117 IU/L   AST 27 0 - 40 IU/L   ALT 36 (H) 0 - 32 IU/L  ALT (SGPT) Piccolo, Waived  Result Value Ref Range   ALT (SGPT) Piccolo, Waived 40 10 - 47 U/L  AST (SGOT) Piccolo, Waived  Result Value Ref Range   AST (SGOT) Piccolo, Waived 35 11 - 38 U/L      Assessment & Plan:   Problem List Items Addressed This Visit      Cardiovascular and Mediastinum   Essential hypertension, benign    DASH guidelines and weight loss encouraged; check creatinine         Hematopoietic and Hemostatic   Thrombocytosis (HCC)    Check CBC        Other   Obesity    See AVS, check TSH today      Relevant Orders   TSH   Medication monitoring encounter    Monitor CMP      Relevant Orders   CBC with Differential/Platelet   Comprehensive metabolic panel   Hyperlipidemia    Check lipids today, avoid saturated fats      Relevant Orders   Lipid panel   Apnea    Very suspicious for obstructive sleep apnea; refer to Dr. Ashby Dawes; weight loss      Relevant Orders   Ambulatory referral to Pulmonology    Other Visit Diagnoses   None.      Follow up plan: Return in about 6 months (around 11/14/2016) for follow-up and fasting labs.  An after-visit summary was printed and given to the patient at [place].  Please see the patient instructions which may contain other information and recommendations beyond what is mentioned above in the assessment and plan.  No orders of the defined types were placed in this encounter.   Orders Placed This Encounter  Procedures  . CBC with Differential/Platelet  . Comprehensive metabolic panel  . Lipid panel  . TSH  . Ambulatory referral to Pulmonology

## 2016-05-14 NOTE — Assessment & Plan Note (Signed)
Very suspicious for obstructive sleep apnea; refer to Dr. Ashby Dawes; weight loss

## 2016-05-14 NOTE — Assessment & Plan Note (Signed)
Check lipids today, avoid saturated fats 

## 2016-05-15 ENCOUNTER — Telehealth: Payer: Self-pay | Admitting: Family Medicine

## 2016-05-15 DIAGNOSIS — R740 Nonspecific elevation of levels of transaminase and lactic acid dehydrogenase [LDH]: Principal | ICD-10-CM

## 2016-05-15 DIAGNOSIS — R748 Abnormal levels of other serum enzymes: Secondary | ICD-10-CM

## 2016-05-15 DIAGNOSIS — R7401 Elevation of levels of liver transaminase levels: Secondary | ICD-10-CM

## 2016-05-15 NOTE — Telephone Encounter (Signed)
I called; reached voicemail; liver enzymes have gone up; don't drink alcohol, no tylenol; we'll talk about this soon; will go over all labs when we reach each other Chart reviewed: SGOT and SGPT were 35 and 40 respectively on Dec 03, 2015 Liver US done Nov 26, 2015 Negative viral hepatitis labs

## 2016-05-17 NOTE — Assessment & Plan Note (Signed)
Back up, refer to GI

## 2016-05-17 NOTE — Telephone Encounter (Signed)
I called again She cannot take statins Really cut back on bacon and sausage and cheese Liver enzymes have gone back up; will refer to GI

## 2016-05-17 NOTE — Assessment & Plan Note (Signed)
Levels have gone back up; refer to GI

## 2016-05-19 NOTE — Progress Notes (Signed)
[facility] Consultation      Assessment and Plan:  Excessive daytime sleepiness.  -Symptoms and signs of obstructive sleep apnea, we'll send for sleep study.  Essential Hypertension.  -Essential hypertension, can be contributed to by sleep apnea. Therefore, it will be important to treat the patient's sleep apnea if present.  Obesity. -Can contribute to obstructive sleep apnea, discussed the importance of weight loss   Date: 05/19/2016  MRN# [id number] Marissa Baldwin Aug 09, 1966  Referring Physician: Dr. Sanda Klein.  Marissa Baldwin is a 50 y.o. old female seen in consultation for chief complaint of:    Chief Complaint  Patient presents with  . sleep consult    per Dr. Sanda Klein. pt c/o daytime sleepiness, loud snoring, restless sleep & husband states she stops breathing during sleep. EPWORTH:8    HPI:   The patient is a 50 yo female with history of obesity and hyperlipidemia referred with symptoms of excessive daytime sleepiness.  She told her doctor recently that her husband noted that she stops breathing and snores loudly.   She has several family member who snore loudly, no one has OSA as far as she knows.   No sleep walking, no sleep attacks. She dozes during the day occasionally. She works part time.   She goes to bed and turns on tv. Then she dozes at 9, wakes up later to turn off tv.   PMHX:   Past Medical History:  Diagnosis Date  . BPPV (benign paroxysmal positional vertigo)   . Golfers elbow of left upper extremity   . Hyperlipidemia   . Hypertension   . Morton's neuroma of right foot   . Postmenopausal symptoms    Surgical Hx:  No past surgical history on file. Family Hx:  Family History  Problem Relation Age of Onset  . Diabetes Father   . Hypertension Father   . Heart disease Father   . Stroke Father     mini strokes  . Cancer Maternal Aunt     rectal  . Hypertension Maternal Grandmother   . Diabetes Maternal Grandfather   . Hypertension  Paternal Grandmother   . Heart disease Paternal Grandfather   . COPD Neg Hx    Social Hx:   Social History  Substance Use Topics  . Smoking status: Never Smoker  . Smokeless tobacco: Never Used  . Alcohol use Yes     Comment: occasional   Medication:       Allergies:  Statins and Sulfa antibiotics  Review of Systems: Gen:  Denies  fever, sweats, chills HEENT: Denies blurred vision, double vision. bleeds Cvc:  No dizziness, chest pain. Resp:   Denies cough or sputum production, shortness of breath Gi: Denies swallowing difficulty, stomach pain. Gu:  Denies bladder incontinence, burning urine Ext:   No Joint pain, stiffness. Skin: No skin rash,  hives  Endoc:  No polyuria, polydipsia. Psych: No depression, insomnia. Other:  All other systems were reviewed with the patient and were negative other that what is mentioned in the HPI.   Physical Examination:   VS: BP 132/74 (BP Location: Left Arm, Cuff Size: Normal)   Pulse 75   Ht 4\' 10"  (1.473 m)   Wt 169 lb (76.7 kg)   SpO2 97%   BMI 35.32 kg/m   General Appearance: No distress  Neuro:without focal findings,  speech normal,  HEENT: PERRLA, EOM intact.  [place] 3.  Pulmonary: normal breath sounds, No wheezing.  CardiovascularNormal S1,S2.  No m/r/g.  Abdomen: Benign, Soft, non-tender. Renal:  No costovertebral tenderness  GU:  No performed at this time. Endoc: No evident thyromegaly, no signs of acromegaly. Skin:   warm, no rashes, no ecchymosis  Extremities: normal, no cyanosis, clubbing.  Other findings:    LABORATORY PANEL:   CBC  Recent Labs Lab 05/14/16 0832  WBC 5.0  HGB 14.5  HCT 42.6  PLT 333   ------------------------------------------------------------------------------------------------------------------  Chemistries   Recent Labs Lab 05/14/16 0832  NA 140  K 4.2  CL 104  CO2 23  GLUCOSE 179*  BUN 18  CREATININE 0.62  CALCIUM 9.6  AST 61*  ALT 93*  ALKPHOS 149*  BILITOT  0.6   ------------------------------------------------------------------------------------------------------------------  Cardiac Enzymes No results for input(s): TROPONINI in the last 168 hours. ------------------------------------------------------------  RADIOLOGY:  No results found.     Thank  you for the consultation and for allowing [facility] to assist in the care of your patient. Our recommendations are noted above.  Please contact us if we can be of further service.   Marda Stalker, MD.  Board Certified in Internal Medicine, Pulmonary Medicine, [place], and Sleep Medicine.  [facility] Office Number: [phone]  Patricia Pesa, M.D.  Vilinda Boehringer, M.D.  Merton Border, M.D  05/19/2016

## 2016-05-21 ENCOUNTER — Ambulatory Visit (INDEPENDENT_AMBULATORY_CARE_PROVIDER_SITE_OTHER): Payer: BLUE CROSS/BLUE SHIELD | Admitting: Internal Medicine

## 2016-05-21 ENCOUNTER — Encounter: Payer: Self-pay | Admitting: Internal Medicine

## 2016-05-21 VITALS — BP 132/74 | HR 75 | Ht <= 58 in | Wt 169.0 lb

## 2016-05-21 DIAGNOSIS — G4719 Other hypersomnia: Secondary | ICD-10-CM | POA: Diagnosis not present

## 2016-05-21 MED ORDER — UMECLIDINIUM-VILANTEROL 62.5-25 MCG/INH IN AEPB: 1.0000 | INHALATION_SPRAY | Freq: Every day | RESPIRATORY_TRACT | 0 refills | Status: DC

## 2016-05-21 NOTE — Patient Instructions (Signed)
--  Will send for sleep study.   --Weight loss may be beneficial for your health.

## 2016-05-26 ENCOUNTER — Encounter: Payer: Self-pay | Admitting: Internal Medicine

## 2016-05-27 NOTE — Telephone Encounter (Signed)
Spoke with [place] and will forward to her to have [place] check on this

## 2016-06-05 DIAGNOSIS — G4733 Obstructive sleep apnea (adult) (pediatric): Secondary | ICD-10-CM | POA: Diagnosis not present

## 2016-06-15 ENCOUNTER — Encounter: Payer: Self-pay | Admitting: Internal Medicine

## 2016-06-16 DIAGNOSIS — G4733 Obstructive sleep apnea (adult) (pediatric): Secondary | ICD-10-CM | POA: Diagnosis not present

## 2016-06-17 ENCOUNTER — Other Ambulatory Visit: Payer: Self-pay | Admitting: *Deleted

## 2016-06-17 DIAGNOSIS — G4719 Other hypersomnia: Secondary | ICD-10-CM

## 2016-06-18 ENCOUNTER — Telehealth: Payer: Self-pay | Admitting: *Deleted

## 2016-06-18 DIAGNOSIS — G4731 Primary central sleep apnea: Secondary | ICD-10-CM

## 2016-06-18 DIAGNOSIS — G4733 Obstructive sleep apnea (adult) (pediatric): Secondary | ICD-10-CM

## 2016-06-18 NOTE — Telephone Encounter (Signed)
Pt informed of results. Order placed for in lab titration.

## 2016-06-18 NOTE — Telephone Encounter (Signed)
-----   Message from Laverle Hobby, MD sent at 06/18/2016 12:31 AM EDT ----- HST showed severe OSA. Because it also showed central sleep apnea, she will need an in-lab cpap titration (not autoPAP). Thanks.

## 2016-06-20 ENCOUNTER — Encounter: Payer: Self-pay | Admitting: Family Medicine

## 2016-06-23 ENCOUNTER — Telehealth: Payer: Self-pay | Admitting: Family Medicine

## 2016-06-23 NOTE — Telephone Encounter (Signed)
She sent this same request through [place] and I already responded to that just earlier this afternoon.

## 2016-06-29 ENCOUNTER — Other Ambulatory Visit: Payer: Self-pay

## 2016-06-29 ENCOUNTER — Telehealth: Payer: Self-pay

## 2016-06-29 ENCOUNTER — Ambulatory Visit (INDEPENDENT_AMBULATORY_CARE_PROVIDER_SITE_OTHER): Payer: BLUE CROSS/BLUE SHIELD | Admitting: Gastroenterology

## 2016-06-29 ENCOUNTER — Encounter: Payer: Self-pay | Admitting: Gastroenterology

## 2016-06-29 VITALS — BP 126/81 | HR 67 | Temp 98.6°F | Ht 59.0 in | Wt 165.0 lb

## 2016-06-29 DIAGNOSIS — K760 Fatty (change of) liver, not elsewhere classified: Secondary | ICD-10-CM

## 2016-06-29 DIAGNOSIS — R748 Abnormal levels of other serum enzymes: Secondary | ICD-10-CM | POA: Diagnosis not present

## 2016-06-29 NOTE — Progress Notes (Signed)
Gastroenterology Consultation  Referring Provider:     Arnetha Courser, MD Primary Care Physician:  Enid Derry, MD Primary Gastroenterologist:  Dr. Allen Norris     Reason for Consultation:     Abnormal liver enzymes        HPI:   [name] is a 50 y.o. y/o female referred for consultation & management of Abnormal liver enzymes by Dr. Enid Derry, MD.  This patient comes in today with a history of having abnormal liver enzymes. The patient states that she found that she had abnormal liver enzymes back in February. The patient had repeat liver enzymes that had improved but now they have come back up. The patient states that she was never told that she had diabetes or high cholesterol. The patient on her last 2 blood draws had glucose of 110 and 179. The most recent lab work of the patient's glucose and of her liver enzymes were back in July. The patient states she has been on a diet since then and has lost approximately 10 pounds. There is no report of any black stools or bloody stools. The patient also denies any fevers or chills.  Past Medical History:  Diagnosis Date  . BPPV (benign paroxysmal positional vertigo)   . Golfers elbow of left upper extremity   . Hyperlipidemia   . Hypertension   . Morton's neuroma of right foot   . Postmenopausal symptoms     Past Surgical History:  Procedure Laterality Date  . No surgical hx      Prior to Admission medications   Medication Sig Start Date End Date Taking? Authorizing Provider  acetaminophen (TYLENOL) 500 MG tablet Take 500 mg by mouth every 6 (six) hours as needed.   Yes Historical Provider, MD  amLODipine (NORVASC) 5 MG tablet Take 5 mg by mouth daily. 09/25/15 09/24/16 Yes Historical Provider, MD  losartan (COZAAR) 50 MG tablet Take 50 mg by mouth daily. 09/25/15 09/24/16 Yes Historical Provider, MD  meclizine (ANTIVERT) 25 MG tablet Take 25 mg by mouth 3 (three) times daily as needed. 09/25/15 09/24/16 Yes Historical Provider, MD    venlafaxine XR (EFFEXOR-XR) 75 MG 24 hr capsule Take 75 mg by mouth daily. 09/25/15 09/24/16 Yes Historical Provider, MD  naproxen sodium (RA NAPROXEN SODIUM) 220 MG tablet Take 220 mg by mouth 2 (two) times daily as needed.    Historical Provider, MD    Family History  Problem Relation Age of Onset  . Diabetes Father   . Hypertension Father   . Heart disease Father   . Stroke Father     mini strokes  . Cancer Maternal Aunt     rectal  . Hypertension Maternal Grandmother   . Diabetes Maternal Grandfather   . Hypertension Paternal Grandmother   . Heart disease Paternal Grandfather   . COPD Neg Hx      Social History  Substance Use Topics  . Smoking status: Never Smoker  . Smokeless tobacco: Never Used  . Alcohol use Yes     Comment: occasional    Allergies as of 06/29/2016 - Review Complete 06/29/2016  Allergen Reaction Noted  . Statins Other (See Comments) 11/13/2015  . Sulfa antibiotics Swelling 06/06/2015    Review of Systems:    All systems reviewed and negative except where noted in HPI.   Physical Exam:  BP 126/81   Pulse 67   Temp 98.6 F (37 C) (Oral)   Ht 4\' 11"  (1.499 m)   Wt  165 lb (74.8 kg)   BMI 33.33 kg/m  No LMP recorded. Patient is postmenopausal. Psych:  Alert and cooperative. Normal mood and affect. General:   Alert,  Well-developed, well-nourished, pleasant and cooperative in NAD Head:  Normocephalic and atraumatic. Eyes:  Sclera clear, no icterus.   Conjunctiva pink. Ears:  Normal auditory acuity. Nose:  No deformity, discharge, or lesions. Mouth:  No deformity or lesions,oropharynx pink & moist. Neck:  Supple; no masses or thyromegaly. Lungs:  Respirations even and unlabored.  Clear throughout to auscultation.   No wheezes, crackles, or rhonchi. No acute distress. Heart:  Regular rate and rhythm; no murmurs, clicks, rubs, or gallops. Abdomen:  Normal bowel sounds.  No bruits.  Soft, non-tender and non-distended without masses,  hepatosplenomegaly or hernias noted.  No guarding or rebound tenderness.  Negative Carnett sign.   Rectal:  Deferred.  Msk:  Symmetrical without gross deformities.  Good, equal movement & strength bilaterally. Pulses:  Normal pulses noted. Extremities:  No clubbing or edema.  No cyanosis. Neurologic:  Alert and oriented x3;  grossly normal neurologically. Skin:  Intact without significant lesions or rashes.  No jaundice. Lymph Nodes:  No significant cervical adenopathy. Psych:  Alert and cooperative. Normal mood and affect.  Imaging Studies: No results found.  Assessment and Plan:   Marissa Baldwin is a 50 y.o. y/o female who comes in today with abnormal liver enzymes. The patient was found to have a fatty liver on ultrasound. The patient has recently lost weight and she states it has been approximately 10 pounds. The patient will have her labs sent off for other possible causes of abnormal liver enzymes besides her fatty liver. The patient has been told to continue losing weight. She has also been told that she should consider having a colonoscopy for screening purposes in the future. The patient will contact our office about when she wants to plan a screening colonoscopy. The patient will also be notified of the results of her blood work to look for other causes of her abnormal liver enzymes. The patient has been explained the plan and agrees with it.   Note: This dictation was prepared with Dragon dictation along with smaller phrase technology. Any transcriptional errors that result from this process are unintentional.

## 2016-06-29 NOTE — Telephone Encounter (Signed)
Screening Colonoscopy Z12.11 [facility] 07/20/2016 [employer] (prior authorization is not required)

## 2016-06-29 NOTE — Telephone Encounter (Signed)
Gastroenterology Pre-Procedure Review  Request Date: 07/20/2016  Requesting Physician: Dr. Sanda Klein  PATIENT REVIEW QUESTIONS: The patient responded to the following health history questions as indicated:    1. Are you having any GI issues? no 2. Do you have a personal history of Polyps? no 3. Do you have a family history of Colon Cancer or Polyps? yes (maternal aunt rectal cancer) 4. Diabetes Mellitus? no 5. Joint replacements in the past 12 months?no 6. Major health problems in the past 3 months?no 7. Any artificial heart valves, MVP, or defibrillator?no    MEDICATIONS & ALLERGIES:    Patient reports the following regarding taking any anticoagulation/antiplatelet therapy:   Plavix, Coumadin, Eliquis, Xarelto, Lovenox, Pradaxa, Brilinta, or Effient? no Aspirin? no  Patient confirms/reports the following medications:  Current Outpatient Prescriptions  Medication Sig Dispense Refill  . acetaminophen (TYLENOL) 500 MG tablet Take 500 mg by mouth every 6 (six) hours as needed.    [name] Kitchen amLODipine (NORVASC) 5 MG tablet Take 5 mg by mouth daily.    [name] Kitchen losartan (COZAAR) 50 MG tablet Take 50 mg by mouth daily.    . meclizine (ANTIVERT) 25 MG tablet Take 25 mg by mouth 3 (three) times daily as needed.    . naproxen sodium (RA NAPROXEN SODIUM) 220 MG tablet Take 220 mg by mouth 2 (two) times daily as needed.    . venlafaxine XR (EFFEXOR-XR) 75 MG 24 hr capsule Take 75 mg by mouth daily.     No current facility-administered medications for this visit.     Patient confirms/reports the following allergies:  Allergies  Allergen Reactions  . Statins Other (See Comments)    Muscle swelling  . Sulfa Antibiotics Swelling    Mouth swelling    No orders of the defined types were placed in this encounter.   AUTHORIZATION INFORMATION Primary Insurance: 1D#: Group #:  Secondary Insurance: 1D#: Group #:  SCHEDULE INFORMATION: Date: 07/20/2016 Time: Location: [facility]

## 2016-06-29 NOTE — Telephone Encounter (Signed)
error 

## 2016-06-30 LAB — ANTI-SMOOTH MUSCLE ANTIBODY, IGG: SMOOTH MUSCLE AB: 25 U — AB (ref 0–19)

## 2016-06-30 LAB — HEPATIC FUNCTION PANEL
ALBUMIN: 4.7 g/dL (ref 3.5–5.5)
ALT: 51 IU/L — ABNORMAL HIGH (ref 0–32)
AST: 40 IU/L (ref 0–40)
Alkaline Phosphatase: 137 IU/L — ABNORMAL HIGH (ref 39–117)
BILIRUBIN TOTAL: 0.2 mg/dL (ref 0.0–1.2)
Bilirubin, Direct: 0.08 mg/dL (ref 0.00–0.40)
TOTAL PROTEIN: 7.9 g/dL (ref 6.0–8.5)

## 2016-06-30 LAB — HEPATITIS PANEL, ACUTE
HEP A IGM: NEGATIVE
HEP B C IGM: NEGATIVE
HEP B S AG: NEGATIVE

## 2016-06-30 LAB — ALPHA-1-ANTITRYPSIN: A1 ANTITRYPSIN: 125 mg/dL (ref 90–200)

## 2016-06-30 LAB — IRON AND TIBC
Iron Saturation: 16 % (ref 15–55)
Iron: 49 ug/dL (ref 27–159)
Total Iron Binding Capacity: 306 ug/dL (ref 250–450)
UIBC: 257 ug/dL (ref 131–425)

## 2016-06-30 LAB — MITOCHONDRIAL ANTIBODIES: Mitochondrial Ab: 8.3 Units (ref 0.0–20.0)

## 2016-06-30 LAB — ANA: Anti Nuclear Antibody(ANA): NEGATIVE

## 2016-06-30 LAB — CERULOPLASMIN: Ceruloplasmin: 41.3 mg/dL — ABNORMAL HIGH (ref 19.0–39.0)

## 2016-07-01 ENCOUNTER — Ambulatory Visit: Payer: BLUE CROSS/BLUE SHIELD | Attending: Pulmonary Disease

## 2016-07-01 ENCOUNTER — Telehealth: Payer: Self-pay

## 2016-07-01 DIAGNOSIS — G4731 Primary central sleep apnea: Secondary | ICD-10-CM | POA: Diagnosis not present

## 2016-07-01 DIAGNOSIS — G4733 Obstructive sleep apnea (adult) (pediatric): Secondary | ICD-10-CM | POA: Insufficient documentation

## 2016-07-01 DIAGNOSIS — Z23 Encounter for immunization: Secondary | ICD-10-CM | POA: Diagnosis not present

## 2016-07-01 NOTE — Telephone Encounter (Signed)
-----   Message from Lucilla Lame, MD sent at 06/30/2016 10:29 AM EDT ----- Let the patient know that her liver enzymes are better but not back to normal. She should continue doing what she is doing and have repeat labs in 3 months.

## 2016-07-01 NOTE — Telephone Encounter (Signed)
Pt notified of lab results. Messaged has been saved to contact pt in December for repeat labs.

## 2016-07-03 DIAGNOSIS — G4730 Sleep apnea, unspecified: Secondary | ICD-10-CM | POA: Diagnosis not present

## 2016-07-07 ENCOUNTER — Telehealth: Payer: Self-pay | Admitting: *Deleted

## 2016-07-07 DIAGNOSIS — G4733 Obstructive sleep apnea (adult) (pediatric): Secondary | ICD-10-CM

## 2016-07-07 NOTE — Telephone Encounter (Signed)
Pt informed of Titration results. Order placed for CPAP.

## 2016-07-14 DIAGNOSIS — G4733 Obstructive sleep apnea (adult) (pediatric): Secondary | ICD-10-CM | POA: Diagnosis not present

## 2016-07-15 ENCOUNTER — Encounter: Payer: Self-pay | Admitting: *Deleted

## 2016-07-16 ENCOUNTER — Other Ambulatory Visit: Payer: Self-pay

## 2016-07-16 ENCOUNTER — Telehealth: Payer: Self-pay | Admitting: Gastroenterology

## 2016-07-16 DIAGNOSIS — Z1211 Encounter for screening for malignant neoplasm of colon: Secondary | ICD-10-CM

## 2016-07-16 MED ORDER — PEG 3350-KCL-NABCB-NACL-NASULF 236 G PO SOLR: ORAL | 0 refills | Status: DC

## 2016-07-16 NOTE — Telephone Encounter (Signed)
Pt notified a cheaper bowel prep was called into [employer]. Pt was given instructions over the phone. Advised to contact me if she has any questions.

## 2016-07-16 NOTE — Telephone Encounter (Signed)
Please call patient. She has colonoscopy on 10/2. She took prescription of Suprep to the pharmacy and they told her it needs a prior authorization and that it would take 5-7 days.

## 2016-07-16 NOTE — Telephone Encounter (Signed)
Patient called and need a pre auth for her prep which she stated could take up to 5 days and she doesn't have that much time. She doesn't want to drink the gallon prep. Please call

## 2016-07-17 NOTE — Discharge Instructions (Signed)

## 2016-07-20 ENCOUNTER — Ambulatory Visit
Admission: RE | Admit: 2016-07-20 | Discharge: 2016-07-20 | Disposition: A | Discharge status: Home / Self Care | Payer: BLUE CROSS/BLUE SHIELD | Source: Ambulatory Visit | Attending: Gastroenterology | Admitting: Gastroenterology

## 2016-07-20 ENCOUNTER — Ambulatory Visit: Payer: BLUE CROSS/BLUE SHIELD | Admitting: Anesthesiology

## 2016-07-20 ENCOUNTER — Encounter
Admission: RE | Disposition: A | Discharge status: Home / Self Care | Payer: Self-pay | Source: Ambulatory Visit | Attending: Gastroenterology

## 2016-07-20 DIAGNOSIS — Z1211 Encounter for screening for malignant neoplasm of colon: Secondary | ICD-10-CM

## 2016-07-20 DIAGNOSIS — K641 Second degree hemorrhoids: Secondary | ICD-10-CM | POA: Diagnosis not present

## 2016-07-20 DIAGNOSIS — H8110 Benign paroxysmal vertigo, unspecified ear: Secondary | ICD-10-CM | POA: Insufficient documentation

## 2016-07-20 DIAGNOSIS — G4730 Sleep apnea, unspecified: Secondary | ICD-10-CM | POA: Diagnosis not present

## 2016-07-20 DIAGNOSIS — K635 Polyp of colon: Secondary | ICD-10-CM | POA: Diagnosis not present

## 2016-07-20 DIAGNOSIS — I10 Essential (primary) hypertension: Secondary | ICD-10-CM | POA: Insufficient documentation

## 2016-07-20 DIAGNOSIS — Z79899 Other long term (current) drug therapy: Secondary | ICD-10-CM | POA: Diagnosis not present

## 2016-07-20 DIAGNOSIS — D123 Benign neoplasm of transverse colon: Secondary | ICD-10-CM

## 2016-07-20 HISTORY — DX: Sleep apnea, unspecified: G47.30

## 2016-07-20 HISTORY — PX: POLYPECTOMY: SHX5525

## 2016-07-20 HISTORY — DX: Unspecified macular degeneration: H35.30

## 2016-07-20 HISTORY — PX: COLONOSCOPY WITH PROPOFOL: SHX5780

## 2016-07-20 SURGERY — COLONOSCOPY WITH PROPOFOL
Anesthesia: Monitor Anesthesia Care

## 2016-07-20 MED ORDER — ACETAMINOPHEN 160 MG/5ML PO SOLN: 325.0000 mg | ORAL | Status: DC | PRN

## 2016-07-20 MED ORDER — PROPOFOL 10 MG/ML IV BOLUS
INTRAVENOUS | Status: DC | PRN
  Administered 2016-07-20: 100 mg via INTRAVENOUS

## 2016-07-20 MED ORDER — SODIUM CHLORIDE 0.9 % IJ SOLN
PREFILLED_SYRINGE | INTRAMUSCULAR | Status: DC | PRN
  Administered 2016-07-20: 1 mL

## 2016-07-20 MED ORDER — LIDOCAINE HCL (CARDIAC) 20 MG/ML IV SOLN
INTRAVENOUS | Status: DC | PRN
  Administered 2016-07-20: 50 mg via INTRAVENOUS

## 2016-07-20 MED ORDER — LACTATED RINGERS IV SOLN
INTRAVENOUS | Status: DC
  Administered 2016-07-20: 08:00:00 via INTRAVENOUS

## 2016-07-20 MED ORDER — ACETAMINOPHEN 325 MG PO TABS: 325.0000 mg | ORAL_TABLET | ORAL | Status: DC | PRN

## 2016-07-20 SURGICAL SUPPLY — 23 items
CANISTER SUCT 1200ML W/VALVE (MISCELLANEOUS) ×3 IMPLANT
CLIP HMST 235XBRD CATH ROT (MISCELLANEOUS) IMPLANT
CLIP RESOLUTION 360 11X235 (MISCELLANEOUS)
FCP ESCP3.2XJMB 240X2.8X (MISCELLANEOUS) ×2
FORCEPS BIOP RAD 4 LRG CAP 4 (CUTTING FORCEPS) IMPLANT
FORCEPS BIOP RJ4 240 W/NDL (MISCELLANEOUS) ×1
FORCEPS ESCP3.2XJMB 240X2.8X (MISCELLANEOUS) ×2 IMPLANT
GOWN CVR UNV OPN BCK APRN NK (MISCELLANEOUS) ×4 IMPLANT
GOWN ISOL THUMB LOOP REG UNIV (MISCELLANEOUS) ×2
INJECTOR VARIJECT VIN23 (MISCELLANEOUS) IMPLANT
KIT DEFENDO VALVE AND CONN (KITS) IMPLANT
KIT ENDO PROCEDURE OLY (KITS) ×3 IMPLANT
MARKER SPOT ENDO TATTOO 5ML (MISCELLANEOUS) IMPLANT
PAD GROUND ADULT SPLIT (MISCELLANEOUS) IMPLANT
PROBE APC STR FIRE (PROBE) IMPLANT
RETRIEVER NET ROTH 2.5X230 LF (MISCELLANEOUS) IMPLANT
SNARE SHORT THROW 13M SML OVAL (MISCELLANEOUS) IMPLANT
SNARE SHORT THROW 30M LRG OVAL (MISCELLANEOUS) IMPLANT
SNARE SNG USE RND 15MM (INSTRUMENTS) IMPLANT
SPOT EX ENDOSCOPIC TATTOO (MISCELLANEOUS)
TRAP ETRAP POLY (MISCELLANEOUS) IMPLANT
VARIJECT INJECTOR VIN23 (MISCELLANEOUS)
WATER STERILE IRR 250ML POUR (IV SOLUTION) ×3 IMPLANT

## 2016-07-20 NOTE — Anesthesia Preprocedure Evaluation (Signed)
Anesthesia Evaluation  Patient identified by MRN, date of birth, ID band Patient awake    Reviewed: Allergy & Precautions, H&P , NPO status , Patient's Chart, lab work & pertinent test results  Airway Mallampati: II  TM Distance: >3 FB Neck ROM: full    Dental no notable dental hx.    Pulmonary sleep apnea ,    Pulmonary exam normal        Cardiovascular hypertension, Normal cardiovascular exam     Neuro/Psych    GI/Hepatic   Endo/Other    Renal/GU      Musculoskeletal   Abdominal   Peds  Hematology   Anesthesia Other Findings   Reproductive/Obstetrics                             Anesthesia Physical Anesthesia Plan  ASA: II  Anesthesia Plan: MAC   Post-op Pain Management:    Induction:   Airway Management Planned:   Additional Equipment:   Intra-op Plan:   Post-operative Plan:   Informed Consent: I have reviewed the patients History and Physical, chart, labs and discussed the procedure including the risks, benefits and alternatives for the proposed anesthesia with the patient or authorized representative who has indicated his/her understanding and acceptance.     Plan Discussed with:   Anesthesia Plan Comments:         Anesthesia Quick Evaluation

## 2016-07-20 NOTE — Anesthesia Postprocedure Evaluation (Signed)
Anesthesia Post Note  Patient: Marissa Baldwin  Procedure(s) Performed: Procedure(s) (LRB): COLONOSCOPY WITH PROPOFOL (N/A) POLYPECTOMY  Patient location during evaluation: PACU Anesthesia Type: MAC Level of consciousness: awake and alert and oriented Pain management: satisfactory to patient Vital Signs Assessment: post-procedure vital signs reviewed and stable Respiratory status: spontaneous breathing, nonlabored ventilation and respiratory function stable Cardiovascular status: blood pressure returned to baseline and stable Postop Assessment: Adequate PO intake and No signs of nausea or vomiting Anesthetic complications: no    Raliegh Ip

## 2016-07-20 NOTE — H&P (Signed)
Marissa Lame, MD [facility] [address], [place], [place] [address] Phone: [phone] Fax : [phone]  Primary Care Physician:  Enid Derry, MD Primary Gastroenterologist:  Dr. Allen Norris  Pre-Procedure History & Physical: HPI:  Marissa Baldwin is a 50 y.o. female is here for a screening colonoscopy.   Past Medical History:  Diagnosis Date  . BPPV (benign paroxysmal positional vertigo)   . Golfers elbow of left upper extremity   . Hyperlipidemia   . Hypertension   . Macular degeneration   . Morton's neuroma of right foot   . Postmenopausal symptoms   . Sleep apnea    CPAP    Past Surgical History:  Procedure Laterality Date  . ESOPHAGOGASTRODUODENOSCOPY      Prior to Admission medications   Medication Sig Start Date End Date Taking? Authorizing Provider  acetaminophen (TYLENOL) 500 MG tablet Take 500 mg by mouth every 6 (six) hours as needed.   Yes Historical Provider, MD  amLODipine (NORVASC) 5 MG tablet Take 5 mg by mouth daily. 09/25/15 09/24/16 Yes Historical Provider, MD  losartan (COZAAR) 50 MG tablet Take 50 mg by mouth daily. 09/25/15 09/24/16 Yes Historical Provider, MD  meclizine (ANTIVERT) 25 MG tablet Take 25 mg by mouth 3 (three) times daily as needed. 09/25/15 09/24/16 Yes Historical Provider, MD  Multiple Vitamins-Minerals (ICAPS AREDS 2 PO) Take by mouth 2 (two) times daily.   Yes Historical Provider, MD  polyethylene glycol (GOLYTELY) 236 g solution Drink one 8 oz glass every 20 mins until stools are clear. 07/16/16  Yes Marissa Lame, MD  venlafaxine XR (EFFEXOR-XR) 75 MG 24 hr capsule Take 75 mg by mouth daily. 09/25/15 09/24/16 Yes Historical Provider, MD    Allergies as of 06/29/2016 - Review Complete 06/29/2016  Allergen Reaction Noted  . Statins Other (See Comments) 11/13/2015  . Sulfa antibiotics Swelling 06/06/2015    Family History  Problem Relation Age of Onset  . Diabetes Father   . Hypertension Father   . Heart disease Father   . Stroke Father     mini strokes  . Cancer Maternal Aunt     rectal  . Hypertension Maternal Grandmother   . Diabetes Maternal Grandfather   . Hypertension Paternal Grandmother   . Heart disease Paternal Grandfather   . COPD Neg Hx     Social History   Social History  . Marital status: Married    Spouse name: N/A  . Number of children: N/A  . Years of education: N/A   Occupational History  . Not on file.   Social History Main Topics  . Smoking status: Never Smoker  . Smokeless tobacco: Never Used  . Alcohol use Yes     Comment: occasional  . Drug use: No  . Sexual activity: Not on file   Other Topics Concern  . Not on file   Social History Narrative  . No narrative on file    Review of Systems: See HPI, otherwise negative ROS  Physical Exam: BP 131/86   Pulse 66   Temp 97.5 F (36.4 C) (Temporal)   Resp 17   Ht 4\' 11"  (1.499 m)   Wt 157 lb (71.2 kg)   SpO2 98%   BMI 31.71 kg/m  General:   Alert,  pleasant and cooperative in NAD Head:  Normocephalic and atraumatic. Neck:  Supple; no masses or thyromegaly. Lungs:  Clear throughout to auscultation.    Heart:  Regular rate and rhythm. Abdomen:  Soft, nontender and nondistended. Normal  bowel sounds, without guarding, and without rebound.   Neurologic:  Alert and  oriented x4;  grossly normal neurologically.  Impression/Plan: Marissa Baldwin is now here to undergo a screening colonoscopy.  Risks, benefits, and alternatives regarding colonoscopy have been reviewed with the patient.  Questions have been answered.  All parties agreeable.

## 2016-07-20 NOTE — Transfer of Care (Signed)
Immediate Anesthesia Transfer of Care Note  Patient: Marissa Baldwin  Procedure(s) Performed: Procedure(s) with comments: COLONOSCOPY WITH PROPOFOL (N/A) - sleep apnea POLYPECTOMY  Patient Location: PACU  Anesthesia Type: MAC  Level of Consciousness: awake, alert  and patient cooperative  Airway and Oxygen Therapy: Patient Spontanous Breathing and Patient connected to supplemental oxygen  Post-op Assessment: Post-op Vital signs reviewed, Patient's Cardiovascular Status Stable, Respiratory Function Stable, Patent Airway and No signs of Nausea or vomiting  Post-op Vital Signs: Reviewed and stable  Complications: No apparent anesthesia complications

## 2016-07-20 NOTE — Op Note (Signed)
[facility] Patient Name: Marissa Baldwin Procedure Date: 07/20/2016 8:24 AM MRN: [id number] Account #: [id number] Date of Birth: 1966-06-07 Admit Type: Outpatient Age: 50 Room: [facility] OR ROOM 01 Gender: Female Note Status: Finalized Procedure:            Colonoscopy Indications:          Screening for colorectal malignant neoplasm Providers:            Lucilla Lame MD, MD Referring MD:         Arnetha Courser (Referring MD) Medicines:            Propofol per Anesthesia Complications:        No immediate complications. Procedure:            Pre-Anesthesia Assessment:                       - Prior to the procedure, a History and Physical was                        performed, and patient medications and allergies were                        reviewed. The patient's tolerance of previous                        anesthesia was also reviewed. The risks and benefits of                        the procedure and the sedation options and risks were                        discussed with the patient. All questions were                        answered, and informed consent was obtained. Prior                        Anticoagulants: The patient has taken no previous                        anticoagulant or antiplatelet agents. ASA Grade                        Assessment: II - A patient with mild systemic disease.                        After reviewing the risks and benefits, the patient was                        deemed in satisfactory condition to undergo the                        procedure.                       After obtaining informed consent, the colonoscope was                        passed under direct vision. Throughout the procedure,  the patient's blood pressure, pulse, and oxygen                        saturations were monitored continuously. The Olympus CF                        H180AL colonoscope (S#: [id number]) was introduced through                         the anus and advanced to the the cecum, identified by                        appendiceal orifice and ileocecal valve. The                        colonoscopy was performed without difficulty. The                        patient tolerated the procedure well. The quality of                        the bowel preparation was excellent. Findings:      The perianal and digital rectal examinations were normal.      A 5 mm polyp was found in the transverse colon. The polyp was sessile.       The polyp was removed with a cold snare. Resection and retrieval were       complete.      Non-bleeding internal hemorrhoids were found during retroflexion. The       hemorrhoids were Grade II (internal hemorrhoids that prolapse but reduce       spontaneously). Impression:           - One 5 mm polyp in the transverse colon, removed with                        a cold snare. Resected and retrieved.                       - Non-bleeding internal hemorrhoids. Recommendation:       - Discharge patient to home.                       - Resume previous diet.                       - Continue present medications.                       - Await pathology results. Procedure Code(s):    --- Professional ---                       [phone], Colonoscopy, flexible; with removal of tumor(s),                        polyp(s), or other lesion(s) by snare technique Diagnosis Code(s):    --- Professional ---                       Z12.11, Encounter for screening for malignant neoplasm  of colon                       D12.3, Benign neoplasm of transverse colon (hepatic                        flexure or splenic flexure) CPT copyright 2016 American Medical Association. All rights reserved. The codes documented in this report are preliminary and upon coder review may  be revised to meet current compliance requirements. Lucilla Lame MD, MD 07/20/2016 8:48:26 AM This report has been signed  electronically. Number of Addenda: 0 Note Initiated On: 07/20/2016 8:24 AM Scope Withdrawal Time: 0 hours 7 minutes 30 seconds  Total Procedure Duration: 0 hours 10 minutes 3 seconds       [facility]

## 2016-07-20 NOTE — Anesthesia Procedure Notes (Signed)
Procedure Name: MAC Performed by: Alorah Mcree Pre-anesthesia Checklist: Patient identified, Emergency Drugs available, Suction available, Timeout performed and Patient being monitored Patient Re-evaluated:Patient Re-evaluated prior to inductionOxygen Delivery Method: Nasal cannula Placement Confirmation: positive ETCO2     

## 2016-07-21 ENCOUNTER — Encounter: Payer: Self-pay | Admitting: Gastroenterology

## 2016-07-23 ENCOUNTER — Encounter: Payer: Self-pay | Admitting: Gastroenterology

## 2016-07-28 ENCOUNTER — Encounter: Payer: Self-pay | Admitting: Gastroenterology

## 2016-07-28 ENCOUNTER — Telehealth: Payer: Self-pay | Admitting: Gastroenterology

## 2016-07-28 NOTE — Telephone Encounter (Signed)
Patient left a voice message that she has been having a stomach ache after she eats. What can she do? Please call

## 2016-07-29 NOTE — Telephone Encounter (Signed)
Pt advised her stomach is not hurting any more. She started taking Pepto with good results. She will contact me if symptoms return. Pt also advised of colonoscopy results. Results letter has been mailed.

## 2016-08-13 DIAGNOSIS — G4733 Obstructive sleep apnea (adult) (pediatric): Secondary | ICD-10-CM | POA: Diagnosis not present

## 2016-09-13 DIAGNOSIS — G4733 Obstructive sleep apnea (adult) (pediatric): Secondary | ICD-10-CM | POA: Diagnosis not present

## 2016-10-10 DIAGNOSIS — J069 Acute upper respiratory infection, unspecified: Secondary | ICD-10-CM | POA: Diagnosis not present

## 2016-10-10 DIAGNOSIS — R05 Cough: Secondary | ICD-10-CM | POA: Diagnosis not present

## 2016-10-21 ENCOUNTER — Other Ambulatory Visit: Payer: Self-pay

## 2016-10-21 ENCOUNTER — Telehealth: Payer: Self-pay | Admitting: Family Medicine

## 2016-10-21 MED ORDER — LOSARTAN POTASSIUM 50 MG PO TABS: 50.0000 mg | ORAL_TABLET | Freq: Every day | ORAL | 0 refills | Status: DC

## 2016-10-21 MED ORDER — AMLODIPINE BESYLATE 5 MG PO TABS: 5.0000 mg | ORAL_TABLET | Freq: Every day | ORAL | 0 refills | Status: DC

## 2016-10-21 NOTE — Telephone Encounter (Signed)
Last cr and K+ reviewed; rxs approved

## 2016-10-21 NOTE — Telephone Encounter (Signed)
Pt needs refill on Amlodipine and Losartan to be sent to [facility]. Pt has an appt scheduled for 11/16/16.

## 2016-10-21 NOTE — Telephone Encounter (Signed)
Pt needs refill on Amlodipine, Losartan to be sent to [facility]. Pt has an appt scheduled for 11/16/16.

## 2016-11-15 ENCOUNTER — Encounter: Payer: Self-pay | Admitting: Family Medicine

## 2016-11-16 ENCOUNTER — Ambulatory Visit (INDEPENDENT_AMBULATORY_CARE_PROVIDER_SITE_OTHER): Payer: BLUE CROSS/BLUE SHIELD | Admitting: Family Medicine

## 2016-11-16 ENCOUNTER — Encounter: Payer: Self-pay | Admitting: Family Medicine

## 2016-11-16 DIAGNOSIS — R0681 Apnea, not elsewhere classified: Secondary | ICD-10-CM | POA: Diagnosis not present

## 2016-11-16 DIAGNOSIS — D473 Essential (hemorrhagic) thrombocythemia: Secondary | ICD-10-CM

## 2016-11-16 DIAGNOSIS — D75839 Thrombocytosis, unspecified: Secondary | ICD-10-CM

## 2016-11-16 DIAGNOSIS — Z5181 Encounter for therapeutic drug level monitoring: Secondary | ICD-10-CM

## 2016-11-16 DIAGNOSIS — Z6831 Body mass index (BMI) 31.0-31.9, adult: Secondary | ICD-10-CM

## 2016-11-16 DIAGNOSIS — R740 Nonspecific elevation of levels of transaminase and lactic acid dehydrogenase [LDH]: Secondary | ICD-10-CM

## 2016-11-16 DIAGNOSIS — E782 Mixed hyperlipidemia: Secondary | ICD-10-CM

## 2016-11-16 DIAGNOSIS — R7401 Elevation of levels of liver transaminase levels: Secondary | ICD-10-CM

## 2016-11-16 DIAGNOSIS — I10 Essential (primary) hypertension: Secondary | ICD-10-CM

## 2016-11-16 DIAGNOSIS — E6609 Other obesity due to excess calories: Secondary | ICD-10-CM | POA: Diagnosis not present

## 2016-11-16 DIAGNOSIS — R945 Abnormal results of liver function studies: Secondary | ICD-10-CM | POA: Insufficient documentation

## 2016-11-16 DIAGNOSIS — R7989 Other specified abnormal findings of blood chemistry: Secondary | ICD-10-CM | POA: Diagnosis not present

## 2016-11-16 MED ORDER — VENLAFAXINE HCL ER 75 MG PO CP24: 75.0000 mg | ORAL_CAPSULE | Freq: Every day | ORAL | 3 refills | Status: DC

## 2016-11-16 MED ORDER — LOSARTAN POTASSIUM 50 MG PO TABS: 50.0000 mg | ORAL_TABLET | Freq: Every day | ORAL | 3 refills | Status: DC

## 2016-11-16 MED ORDER — AMLODIPINE BESYLATE 5 MG PO TABS: 5.0000 mg | ORAL_TABLET | Freq: Every day | ORAL | 3 refills | Status: DC

## 2016-11-16 NOTE — Assessment & Plan Note (Signed)
Check at [place]; fasting; limit saturated fats and get plenty of whole grains

## 2016-11-16 NOTE — Patient Instructions (Addendum)
Try to use PLAIN allergy medicine without the decongestant Avoid: phenylephrine, phenylpropanolamine, and pseudoephredine  Please have fasting labs done soon Return in 6-1/2 months

## 2016-11-16 NOTE — Assessment & Plan Note (Signed)
Recheck platelet count

## 2016-11-16 NOTE — Assessment & Plan Note (Signed)
Check labs today; already saw GI; asymptomatic

## 2016-11-16 NOTE — Assessment & Plan Note (Signed)
Praise given for her efforts

## 2016-11-16 NOTE — Progress Notes (Signed)
BP 124/80   Pulse 66   Temp 98.8 F (37.1 C) (Oral)   Resp 14   Wt 155 lb (70.3 kg)   SpO2 97%   BMI 31.31 kg/m    Subjective:    Patient ID: Marissa Baldwin, female    DOB: Aug 21, 1966, 51 y.o.   MRN: [id number]  HPI: Marissa Baldwin is a 51 y.o. female  Chief Complaint  Patient presents with  . Follow-up    6 months    Here for f/u Flu shot UTD  High blood pressure; taking amlodipine and losartan; checks BP at home and it's 117/70 something, well-controlled; knows to stay away from salt; uses coricidin and flonase  High cholesterol; trying to stay away from fatty meats; tries to fish and chicken; occasional hamburger, tries to watch fat; tries to get Cheerios and oatmeal; last LDL 175; ; she does not want medicine, wants to try diet and exercise; thinks high cholesterol runs in the family  Macular degeneration; reading glasses weren't getting it any more; not a smoker; diagnosed with macular degeneration; AREDS  Mammogram due in August 2018 at the very latest; no lumps or bumps  She went to the GI doctor and had labs drawn; last labs Sept; ASMA slightly elevated, 25; SGOT and SGPT and alk phos were elevated but coming down; no abdominal pain or nausea  Sleep apnea; using CPAP with good results  Obesity; doing diet and exercise  Hot flashes helped by SNRI  Depression screen [facility] 2/9 11/16/2016 05/14/2016  Decreased Interest 0 0  Down, Depressed, Hopeless 0 0  PHQ - 2 Score 0 0   Relevant past medical, surgical, family and social history reviewed Past Medical History:  Diagnosis Date  . BPPV (benign paroxysmal positional vertigo)   . Golfers elbow of left upper extremity   . Hyperlipidemia   . Hypertension   . Macular degeneration   . Morton's neuroma of right foot   . Postmenopausal symptoms   . Sleep apnea    CPAP   Past Surgical History:  Procedure Laterality Date  . COLONOSCOPY WITH PROPOFOL N/A 07/20/2016   Procedure: COLONOSCOPY WITH PROPOFOL;  Surgeon: Lucilla Lame, MD;  Location: [facility];  Service: Endoscopy;  Laterality: N/A;  sleep apnea  . ESOPHAGOGASTRODUODENOSCOPY    . POLYPECTOMY  07/20/2016   Procedure: POLYPECTOMY;  Surgeon: Lucilla Lame, MD;  Location: [place];  Service: Endoscopy;;   Family History  Problem Relation Age of Onset  . Diabetes Father   . Hypertension Father   . Heart disease Father   . Stroke Father     mini strokes  . Cancer Maternal Aunt     rectal  . Hypertension Maternal Grandmother   . Diabetes Maternal Grandfather   . Hypertension Paternal Grandmother   . Heart disease Paternal Grandfather   . COPD Neg Hx    Social History  Substance Use Topics  . Smoking status: Never Smoker  . Smokeless tobacco: Never Used  . Alcohol use Yes     Comment: occasional   Interim medical history since last visit reviewed. Allergies and medications reviewed  Review of Systems Per HPI unless specifically indicated above     Objective:    BP 124/80   Pulse 66   Temp 98.8 F (37.1 C) (Oral)   Resp 14   Wt 155 lb (70.3 kg)   SpO2 97%   BMI 31.31 kg/m   Wt Readings from Last 3 Encounters:  11/16/16  155 lb (70.3 kg)  07/20/16 157 lb (71.2 kg)  06/29/16 165 lb (74.8 kg)    Physical Exam  Constitutional: She appears well-developed and well-nourished. No distress.  Obese, but weight down 10 pounds over last 4-1/2 months  HENT:  Head: Normocephalic and atraumatic.  Eyes: EOM are normal. No scleral icterus.  Neck: No thyromegaly present.  Cardiovascular: Normal rate, regular rhythm and normal heart sounds.   No murmur heard. Pulmonary/Chest: Effort normal and breath sounds normal. No respiratory distress.  Abdominal: Soft. Bowel sounds are normal. She exhibits no distension.  Musculoskeletal: Normal range of motion. She exhibits no edema.  Neurological: She is alert.  Skin: Skin is warm and dry. She is not diaphoretic. No pallor.  Psychiatric: She has a normal mood and affect. Her  behavior is normal. Judgment and thought content normal.    Results for orders placed or performed in visit on 06/29/16  Ceruloplasmin  Result Value Ref Range   Ceruloplasmin 41.3 (H) 19.0 - 39.0 mg/dL  ANA  Result Value Ref Range   Anit Nuclear Antibody(ANA) Negative Negative  Hepatic function panel  Result Value Ref Range   Total Protein 7.9 6.0 - 8.5 g/dL   Albumin 4.7 3.5 - 5.5 g/dL   Bilirubin Total 0.2 0.0 - 1.2 mg/dL   Bilirubin, Direct 0.08 0.00 - 0.40 mg/dL   Alkaline Phosphatase 137 (H) 39 - 117 IU/L   AST 40 0 - 40 IU/L   ALT 51 (H) 0 - 32 IU/L  Mitochondrial antibodies  Result Value Ref Range   Mitochondrial Ab 8.3 0.0 - 20.0 Units  Anti-smooth muscle antibody, IgG  Result Value Ref Range   Smooth Muscle Ab 25 (H) 0 - 19 Units  Alpha-1-antitrypsin  Result Value Ref Range   A-1 Antitrypsin 125 90 - 200 mg/dL  Hepatitis panel, acute  Result Value Ref Range   Hep A IgM Negative Negative   Hepatitis B Surface Ag Negative Negative   Hep B C IgM Negative Negative   Hep C Virus Ab <0.1 0.0 - 0.9 s/co ratio  Iron and TIBC  Result Value Ref Range   Total Iron Binding Capacity 306 250 - 450 ug/dL   UIBC 257 131 - 425 ug/dL   Iron 49 27 - 159 ug/dL   Iron Saturation 16 15 - 55 %      Assessment & Plan:   Problem List Items Addressed This Visit      Cardiovascular and Mediastinum   Essential hypertension, benign    coontrolled today; continue meds; try DASH guidelines; avoid decongestants      Relevant Medications   losartan (COZAAR) 50 MG tablet   amLODipine (NORVASC) 5 MG tablet     Hematopoietic and Hemostatic   Thrombocytosis (HCC)    Recheck platelet count      Relevant Orders   CBC with Differential/Platelet     Other   Obesity    Praise given for her efforts      Medication monitoring encounter    Check labs      Hyperlipidemia    Check at [place]; fasting; limit saturated fats and get plenty of whole grains      Relevant Medications     losartan (COZAAR) 50 MG tablet   amLODipine (NORVASC) 5 MG tablet   Other Relevant Orders   Lipid panel   Elevated serum glutamic pyruvic transaminase (SGPT) level    Check labs today; already saw GI; asymptomatic      Relevant  Orders   Comprehensive Metabolic Panel (CMET)   Apnea    Continue CPAP      Abnormal liver function test    Recheck ASMA      Relevant Orders   Anti-Smooth Muscle Antibody, IGG      Follow up plan: Return in about 6 months (around 05/31/2017) for visit and fasting labs.  An after-visit summary was printed and given to the patient at [place].  Please see the patient instructions which may contain other information and recommendations beyond what is mentioned above in the assessment and plan.  Meds ordered this encounter  Medications  . milk thistle 175 MG tablet    Sig: Take 175 mg by mouth daily.  . benzonatate (TESSALON) 100 MG capsule    Sig: Take 100 mg by mouth 3 (three) times daily as needed for cough.  . DISCONTD: venlafaxine XR (EFFEXOR-XR) 75 MG 24 hr capsule    Sig: Take by mouth.  . meclizine (ANTIVERT) 25 MG tablet    Sig: Take by mouth.  . fluticasone (FLONASE ALLERGY RELIEF) 50 MCG/ACT nasal spray    Sig: Place 2 sprays into both nostrils daily.  [name] Kitchen venlafaxine XR (EFFEXOR-XR) 75 MG 24 hr capsule    Sig: Take 1 capsule (75 mg total) by mouth daily with breakfast.    Dispense:  90 capsule    Refill:  3  . losartan (COZAAR) 50 MG tablet    Sig: Take 1 tablet (50 mg total) by mouth daily.    Dispense:  90 tablet    Refill:  3  . amLODipine (NORVASC) 5 MG tablet    Sig: Take 1 tablet (5 mg total) by mouth daily.    Dispense:  90 tablet    Refill:  3    Orders Placed This Encounter  Procedures  . CBC with Differential/Platelet  . Comprehensive Metabolic Panel (CMET)  . Lipid panel  . Anti-Smooth Muscle Antibody, IGG

## 2016-11-16 NOTE — Assessment & Plan Note (Signed)
Check labs 

## 2016-11-16 NOTE — Assessment & Plan Note (Signed)
coontrolled today; continue meds; try DASH guidelines; avoid decongestants

## 2016-11-16 NOTE — Assessment & Plan Note (Signed)
Continue CPAP.  

## 2016-11-16 NOTE — Assessment & Plan Note (Signed)
Recheck ASMA

## 2016-11-23 DIAGNOSIS — G4733 Obstructive sleep apnea (adult) (pediatric): Secondary | ICD-10-CM | POA: Diagnosis not present

## 2017-01-01 ENCOUNTER — Telehealth: Payer: Self-pay

## 2017-01-01 ENCOUNTER — Other Ambulatory Visit: Payer: Self-pay

## 2017-01-01 DIAGNOSIS — R748 Abnormal levels of other serum enzymes: Secondary | ICD-10-CM

## 2017-01-01 NOTE — Telephone Encounter (Signed)
-----   Message from [place], [place] sent at 07/01/2016 11:04 AM EDT ----- Pt needs repeat LFT's

## 2017-01-01 NOTE — Telephone Encounter (Signed)
Spoke with pt regarding repeat lab. Pt will go to [facility] lab to have this done.

## 2017-01-11 DIAGNOSIS — G4733 Obstructive sleep apnea (adult) (pediatric): Secondary | ICD-10-CM | POA: Diagnosis not present

## 2017-01-26 ENCOUNTER — Other Ambulatory Visit
Admission: RE | Admit: 2017-01-26 | Discharge: 2017-01-26 | Disposition: A | Discharge status: Home / Self Care | Payer: BLUE CROSS/BLUE SHIELD | Source: Ambulatory Visit | Attending: Gastroenterology | Admitting: Gastroenterology

## 2017-01-26 DIAGNOSIS — R748 Abnormal levels of other serum enzymes: Secondary | ICD-10-CM | POA: Diagnosis not present

## 2017-01-26 LAB — HEPATIC FUNCTION PANEL
ALK PHOS: 95 U/L (ref 38–126)
ALT: 28 U/L (ref 14–54)
AST: 27 U/L (ref 15–41)
Albumin: 4.2 g/dL (ref 3.5–5.0)
BILIRUBIN TOTAL: 0.8 mg/dL (ref 0.3–1.2)
Bilirubin, Direct: <0.1 mg/dL — ABNORMAL LOW (ref 0.1–0.5)
TOTAL PROTEIN: 7.5 g/dL (ref 6.5–8.1)

## 2017-01-27 ENCOUNTER — Telehealth: Payer: Self-pay

## 2017-01-27 NOTE — Telephone Encounter (Signed)
-----   Message from Lucilla Lame, MD sent at 01/26/2017  8:07 PM EDT ----- That the patient know that her liver enzymes came back to normal

## 2017-01-27 NOTE — Telephone Encounter (Signed)
Pt notified labs were normal.

## 2017-02-11 DIAGNOSIS — G4733 Obstructive sleep apnea (adult) (pediatric): Secondary | ICD-10-CM | POA: Diagnosis not present

## 2017-02-25 DIAGNOSIS — G4733 Obstructive sleep apnea (adult) (pediatric): Secondary | ICD-10-CM | POA: Diagnosis not present

## 2017-03-13 DIAGNOSIS — G4733 Obstructive sleep apnea (adult) (pediatric): Secondary | ICD-10-CM | POA: Diagnosis not present

## 2017-04-13 DIAGNOSIS — G4733 Obstructive sleep apnea (adult) (pediatric): Secondary | ICD-10-CM | POA: Diagnosis not present

## 2017-05-12 ENCOUNTER — Encounter: Payer: Self-pay | Admitting: Unknown Physician Specialty

## 2017-05-12 ENCOUNTER — Ambulatory Visit (INDEPENDENT_AMBULATORY_CARE_PROVIDER_SITE_OTHER): Payer: BLUE CROSS/BLUE SHIELD | Admitting: Unknown Physician Specialty

## 2017-05-12 VITALS — BP 117/77 | HR 64 | Temp 98.5°F | Ht 58.7 in | Wt 166.0 lb

## 2017-05-12 DIAGNOSIS — I10 Essential (primary) hypertension: Secondary | ICD-10-CM

## 2017-05-12 DIAGNOSIS — E1129 Type 2 diabetes mellitus with other diabetic kidney complication: Secondary | ICD-10-CM | POA: Insufficient documentation

## 2017-05-12 DIAGNOSIS — Z6831 Body mass index (BMI) 31.0-31.9, adult: Secondary | ICD-10-CM

## 2017-05-12 DIAGNOSIS — G4700 Insomnia, unspecified: Secondary | ICD-10-CM | POA: Insufficient documentation

## 2017-05-12 DIAGNOSIS — R7989 Other specified abnormal findings of blood chemistry: Secondary | ICD-10-CM

## 2017-05-12 DIAGNOSIS — F5101 Primary insomnia: Secondary | ICD-10-CM | POA: Diagnosis not present

## 2017-05-12 DIAGNOSIS — E6609 Other obesity due to excess calories: Secondary | ICD-10-CM | POA: Diagnosis not present

## 2017-05-12 DIAGNOSIS — R7301 Impaired fasting glucose: Secondary | ICD-10-CM

## 2017-05-12 DIAGNOSIS — R945 Abnormal results of liver function studies: Secondary | ICD-10-CM | POA: Diagnosis not present

## 2017-05-12 DIAGNOSIS — R809 Proteinuria, unspecified: Secondary | ICD-10-CM | POA: Insufficient documentation

## 2017-05-12 MED ORDER — FLUTICASONE PROPIONATE 50 MCG/ACT NA SUSP: 2.0000 | Freq: Every day | NASAL | 12 refills | Status: DC

## 2017-05-12 NOTE — Assessment & Plan Note (Signed)
Discussed sleep hygeine and supplements

## 2017-05-12 NOTE — Assessment & Plan Note (Signed)
Stable, continue present medications.   

## 2017-05-12 NOTE — Patient Instructions (Addendum)
Exercise  Magnesium supplement - Citrate or Glycinate  Valerian/Kava/L theanine/passion flower Don't be awake in bed  Melatonin - 1 mg and hour before bed- I add Valerian, Magnesium and L Theanine with Chamomile  Caffeine [email or web address]

## 2017-05-12 NOTE — Assessment & Plan Note (Signed)
Check today 

## 2017-05-12 NOTE — Progress Notes (Signed)
BP 117/77   Pulse 64   Temp 98.5 F (36.9 C)   Ht 4' 10.7" (1.491 m)   Wt 166 lb (75.3 kg)   SpO2 98%   BMI 33.87 kg/m    Subjective:    Patient ID: Marissa Baldwin, female    DOB: Jun 10, 1966, 51 y.o.   MRN: [id number]  HPI: Marissa Baldwin is a 51 y.o. female  Chief Complaint  Patient presents with  . Hypertension   Hypertension  Using medications without difficulty Average home BPs  122/80   Using medication without problems or lightheadedness No chest pain with exertion or shortness of breath No Edema   Menopausal symptoms Taking Venlafaxine for this.  States she feels congested on CPAP and Flonase helps but ran out.  However struggles with insomnia and can't relax in the evening  Depression screen [facility] 2/9 05/12/2017 11/16/2016 05/14/2016  Decreased Interest 0 0 0  Down, Depressed, Hopeless 0 0 0  PHQ - 2 Score 0 0 0  Altered sleeping 3 - -  Tired, decreased energy 1 - -  Change in appetite 3 - -  Feeling bad or failure about yourself  1 - -  Trouble concentrating 0 - -  Moving slowly or fidgety/restless 0 - -  Suicidal thoughts 0 - -  PHQ-9 Score 8 - -   Elevated liver enzymes They are up and down.  Echo consistent woth fatty liver  Relevant past medical, surgical, family and social history reviewed and updated as indicated. Interim medical history since our last visit reviewed. Allergies and medications reviewed and updated.  Review of Systems  Constitutional: Negative.   Respiratory: Negative.   Gastrointestinal: Negative.   Genitourinary: Negative.     Per HPI unless specifically indicated above     Objective:    BP 117/77   Pulse 64   Temp 98.5 F (36.9 C)   Ht 4' 10.7" (1.491 m)   Wt 166 lb (75.3 kg)   SpO2 98%   BMI 33.87 kg/m   Wt Readings from Last 3 Encounters:  05/12/17 166 lb (75.3 kg)  11/16/16 155 lb (70.3 kg)  07/20/16 157 lb (71.2 kg)    Physical Exam  Constitutional: She is oriented to person, place, and time. She appears  well-developed and well-nourished. No distress.  HENT:  Head: Normocephalic and atraumatic.  Eyes: Conjunctivae and lids are normal. Right eye exhibits no discharge. Left eye exhibits no discharge. No scleral icterus.  Neck: Normal range of motion. Neck supple. No JVD present. Carotid bruit is not present.  Cardiovascular: Normal rate, regular rhythm and normal heart sounds.   Pulmonary/Chest: Effort normal and breath sounds normal.  Abdominal: Normal appearance. There is no splenomegaly or hepatomegaly.  Musculoskeletal: Normal range of motion.  Neurological: She is alert and oriented to person, place, and time.  Skin: Skin is warm, dry and intact. No rash noted. No pallor.  Psychiatric: She has a normal mood and affect. Her behavior is normal. Judgment and thought content normal.    Results for orders placed or performed during the hospital encounter of 01/26/17  Hepatic function panel  Result Value Ref Range   Total Protein 7.5 6.5 - 8.1 g/dL   Albumin 4.2 3.5 - 5.0 g/dL   AST 27 15 - 41 U/L   ALT 28 14 - 54 U/L   Alkaline Phosphatase 95 38 - 126 U/L   Total Bilirubin 0.8 0.3 - 1.2 mg/dL   Bilirubin, Direct <0.1 (L) 0.1 -  0.5 mg/dL   Indirect Bilirubin NOT CALCULATED 0.3 - 0.9 mg/dL      Assessment & Plan:   Problem List Items Addressed This Visit      Unprioritized   Abnormal liver function test    Check today      Relevant Orders   Comprehensive metabolic panel   Lipid Panel w/o Chol/HDL Ratio   Essential hypertension, benign    Stable, continue present medications.        Relevant Orders   Bayer DCA Hb A1c Waived   IFG (impaired fasting glucose)    Noted on labs.  Will check today including Hgb A1C      Relevant Orders   Bayer DCA Hb A1c Waived   Insomnia    Discussed sleep hygeine and supplements      Obesity - Primary   Relevant Orders   TSH      The 10-year ASCVD risk score Mikey Bussing DC Jr., et al., 2013) is: 2.1%   Values used to calculate the  score:     Age: 79 years     Sex: Female     Is Non-Hispanic African American: No     Diabetic: No     Tobacco smoker: No     Systolic Blood Pressure: 587 mmHg     Is BP treated: Yes     HDL Cholesterol: 52 mg/dL     Total Cholesterol: 247 mg/dL  Follow up plan: Return in about 6 months (around 11/12/2017) for physica.

## 2017-05-12 NOTE — Assessment & Plan Note (Signed)
Noted on labs.  Will check today including Hgb A1C

## 2017-05-13 LAB — COMPREHENSIVE METABOLIC PANEL
ALBUMIN: 4.5 g/dL (ref 3.5–5.5)
ALT: 32 IU/L (ref 0–32)
AST: 29 IU/L (ref 0–40)
Albumin/Globulin Ratio: 1.5 (ref 1.2–2.2)
Alkaline Phosphatase: 109 IU/L (ref 39–117)
BUN / CREAT RATIO: 35 — AB (ref 9–23)
BUN: 19 mg/dL (ref 6–24)
Bilirubin Total: 0.3 mg/dL (ref 0.0–1.2)
CO2: 24 mmol/L (ref 20–29)
CREATININE: 0.54 mg/dL — AB (ref 0.57–1.00)
Calcium: 9.1 mg/dL (ref 8.7–10.2)
Chloride: 101 mmol/L (ref 96–106)
GFR calc non Af Amer: 110 mL/min/{1.73_m2} (ref >59)
GFR, EST AFRICAN AMERICAN: 126 mL/min/{1.73_m2} (ref >59)
GLUCOSE: 87 mg/dL (ref 65–99)
Globulin, Total: 3 g/dL (ref 1.5–4.5)
Potassium: 4 mmol/L (ref 3.5–5.2)
Sodium: 139 mmol/L (ref 134–144)
TOTAL PROTEIN: 7.5 g/dL (ref 6.0–8.5)

## 2017-05-13 LAB — LIPID PANEL W/O CHOL/HDL RATIO
Cholesterol, Total: 214 mg/dL — ABNORMAL HIGH (ref 100–199)
HDL: 55 mg/dL (ref >39)
LDL CALC: 140 mg/dL — AB (ref 0–99)
Triglycerides: 97 mg/dL (ref 0–149)
VLDL CHOLESTEROL CAL: 19 mg/dL (ref 5–40)

## 2017-05-13 LAB — BAYER DCA HB A1C WAIVED: HB A1C: 5.9 % (ref <7.0)

## 2017-05-13 LAB — TSH: TSH: 1.07 u[IU]/mL (ref 0.450–4.500)

## 2017-05-14 ENCOUNTER — Encounter: Payer: Self-pay | Admitting: Unknown Physician Specialty

## 2017-05-14 NOTE — Progress Notes (Signed)
Notified pt by mychart

## 2017-05-17 ENCOUNTER — Ambulatory Visit: Payer: BLUE CROSS/BLUE SHIELD | Admitting: Family Medicine

## 2017-06-23 ENCOUNTER — Encounter: Payer: Self-pay | Admitting: Internal Medicine

## 2017-06-24 ENCOUNTER — Encounter: Payer: Self-pay | Admitting: Internal Medicine

## 2017-06-24 ENCOUNTER — Ambulatory Visit (INDEPENDENT_AMBULATORY_CARE_PROVIDER_SITE_OTHER): Payer: BLUE CROSS/BLUE SHIELD | Admitting: Internal Medicine

## 2017-06-24 ENCOUNTER — Telehealth: Payer: Self-pay | Admitting: Internal Medicine

## 2017-06-24 VITALS — BP 110/82 | HR 81 | Resp 16 | Ht 58.7 in | Wt 168.0 lb

## 2017-06-24 DIAGNOSIS — G4733 Obstructive sleep apnea (adult) (pediatric): Secondary | ICD-10-CM

## 2017-06-24 MED ORDER — AMBULATORY NON FORMULARY MEDICATION
0 refills | Status: AC
Start: 2017-06-24 — End: ?

## 2017-06-24 NOTE — Telephone Encounter (Signed)
This will be addressed at office visit today. Nothing further needed.

## 2017-06-24 NOTE — Progress Notes (Signed)
[facility] Consultation      Assessment and Plan:  Obstructive sleep apnea. -Severe obstructive sleep apnea, started on CPAP at 51. -Usage has been low recently, patient states that this is due to poor supplies, with frequent leaks. We have given her prescription for new supplies, she is asked to use the mask all night, every night to get the maximum benefit.  Essential Hypertension.  -Essential hypertension, can be contributed to by sleep apnea. Therefore, it will be important to treat the patient's sleep apnea if present.  Obesity. -Can contribute to obstructive sleep apnea, discussed the importance of weight loss   Date: 06/24/2017  MRN# [id number] Marissa Baldwin 10-Jan-1966  Referring Physician: Dr. Sanda Klein.  Marissa Baldwin is a 51 y.o. old female seen in consultation for chief complaint of:    Chief Complaint  Patient presents with  . Sleep Apnea    cpap follow up patient needs rx for new cpap supplies    HPI:   The patient is a 51 yo female she has been diagnosed with OSA, she has been using it most night for 3 to 4 hours. She was using it every night, but now has not been doing it as often since her mask has been wearing out.   Review of one month download data as of 06/23/17; uses greater than 4 hours is 30%, total usages 23/30 days. Average usage on days used as 3 hours 46 minutes. Septic pressures 11, residual AHI is 1.9.  Medication:    Current Outpatient Prescriptions:  .  acetaminophen (TYLENOL) 500 MG tablet, Take 500 mg by mouth every 6 (six) hours as needed., Disp: , Rfl:  .  AMBULATORY NON FORMULARY MEDICATION, Medication Name: Please dispense cpap mask of choice and new supplies. DX G47.33, Disp: 1 each, Rfl: 0 .  amLODipine (NORVASC) 5 MG tablet, Take 1 tablet (5 mg total) by mouth daily., Disp: 90 tablet, Rfl: 3 .  fluticasone (FLONASE ALLERGY RELIEF) 50 MCG/ACT nasal spray, Place 2 sprays into both nostrils daily., Disp: 16 g, Rfl: 12 .  losartan  (COZAAR) 50 MG tablet, Take 1 tablet (50 mg total) by mouth daily., Disp: 90 tablet, Rfl: 3 .  Multiple Vitamins-Minerals (ICAPS AREDS 2 PO), Take by mouth 2 (two) times daily., Disp: , Rfl:  .  Turmeric 500 MG CAPS, Take 2 capsules by mouth daily., Disp: , Rfl:  .  venlafaxine XR (EFFEXOR-XR) 75 MG 24 hr capsule, Take 1 capsule (75 mg total) by mouth daily with breakfast., Disp: 90 capsule, Rfl: 3    Allergies:  Statins and Sulfa antibiotics  Review of Systems: Gen:  Denies  fever, sweats, chills HEENT: Denies blurred vision, double vision. bleeds Cvc:  No dizziness, chest pain. Resp:   Denies cough or sputum production, shortness of breath Gi: Denies swallowing difficulty, stomach pain. Gu:  Denies bladder incontinence, burning urine Ext:   No Joint pain, stiffness. Skin: No skin rash,  hives  Endoc:  No polyuria, polydipsia. Psych: No depression, insomnia. Other:  All other systems were reviewed with the patient and were negative other that what is mentioned in the HPI.   Physical Examination:   VS: BP 110/82 (BP Location: Left Arm, Cuff Size: Normal)   Pulse 81   Resp 16   Ht 4' 10.7" (1.491 m)   Wt 168 lb (76.2 kg)   SpO2 96%   BMI 34.28 kg/m   General Appearance: No distress  Neuro:without focal findings,  speech normal,  HEENT: PERRLA, EOM intact.  [place] 3.  Pulmonary: normal breath sounds, No wheezing.  CardiovascularNormal S1,S2.  No m/r/g.   Abdomen: Benign, Soft, non-tender. Renal:  No costovertebral tenderness  GU:  No performed at this time. Endoc: No evident thyromegaly, no signs of acromegaly. Skin:   warm, no rashes, no ecchymosis  Extremities: normal, no cyanosis, clubbing.  Other findings:    LABORATORY PANEL:   CBC No results for input(s): WBC, HGB, HCT, PLT in the last 168 hours. ------------------------------------------------------------------------------------------------------------------  Chemistries  No results for input(s): NA, K,  CL, CO2, GLUCOSE, BUN, CREATININE, CALCIUM, MG, AST, ALT, ALKPHOS, BILITOT in the last 168 hours.  Invalid input(s): GFRCGP ------------------------------------------------------------------------------------------------------------------  Cardiac Enzymes No results for input(s): TROPONINI in the last 168 hours. ------------------------------------------------------------  RADIOLOGY:  No results found.     Thank  you for the consultation and for allowing [facility] to assist in the care of your patient. Our recommendations are noted above.  Please contact us if we can be of further service.   Marda Stalker, MD.  Board Certified in Internal Medicine, Pulmonary Medicine, [place], and Sleep Medicine.  [facility] Office Number: [phone]  Patricia Pesa, M.D.  Vilinda Boehringer, M.D.  Merton Border, M.D  06/24/2017

## 2017-06-24 NOTE — Patient Instructions (Signed)
Use cpap all night, every night to get the most benefit.

## 2017-06-24 NOTE — Telephone Encounter (Signed)
Patient came to office to let us know [facility] over with   the rx for Cpap Mask and all Supplies with a dx code    Original copy of sleep study   OV note within 6 months stating pt uses CPAP and benefits from therapy   Patient demographics     Fax: [phone]   PATIENT HAS NOT BEEN SINCE 2017   ADDED To same day slot with dr. Juanell Fairly at 215pm

## 2017-06-28 ENCOUNTER — Telehealth: Payer: Self-pay | Admitting: Internal Medicine

## 2017-06-28 DIAGNOSIS — G4733 Obstructive sleep apnea (adult) (pediatric): Secondary | ICD-10-CM

## 2017-06-28 NOTE — Telephone Encounter (Signed)
dme ordered submitted. Nothing further needed.

## 2017-06-28 NOTE — Telephone Encounter (Signed)
Needs rx for CPAP, mask ,tubing and water chambers.

## 2017-07-09 DIAGNOSIS — G4733 Obstructive sleep apnea (adult) (pediatric): Secondary | ICD-10-CM | POA: Diagnosis not present

## 2017-07-24 DIAGNOSIS — Z23 Encounter for immunization: Secondary | ICD-10-CM | POA: Diagnosis not present

## 2017-08-14 DIAGNOSIS — R05 Cough: Secondary | ICD-10-CM | POA: Diagnosis not present

## 2017-08-14 DIAGNOSIS — J0120 Acute ethmoidal sinusitis, unspecified: Secondary | ICD-10-CM | POA: Diagnosis not present

## 2017-10-25 DIAGNOSIS — G4733 Obstructive sleep apnea (adult) (pediatric): Secondary | ICD-10-CM | POA: Diagnosis not present

## 2017-11-12 ENCOUNTER — Encounter: Payer: Self-pay | Admitting: Unknown Physician Specialty

## 2017-11-12 ENCOUNTER — Ambulatory Visit (INDEPENDENT_AMBULATORY_CARE_PROVIDER_SITE_OTHER): Payer: BLUE CROSS/BLUE SHIELD | Admitting: Unknown Physician Specialty

## 2017-11-12 VITALS — BP 142/93 | HR 69 | Temp 98.4°F | Ht 59.0 in | Wt 171.5 lb

## 2017-11-12 DIAGNOSIS — F325 Major depressive disorder, single episode, in full remission: Secondary | ICD-10-CM | POA: Diagnosis not present

## 2017-11-12 DIAGNOSIS — E669 Obesity, unspecified: Secondary | ICD-10-CM | POA: Diagnosis not present

## 2017-11-12 DIAGNOSIS — J4520 Mild intermittent asthma, uncomplicated: Secondary | ICD-10-CM | POA: Diagnosis not present

## 2017-11-12 DIAGNOSIS — J45909 Unspecified asthma, uncomplicated: Secondary | ICD-10-CM | POA: Insufficient documentation

## 2017-11-12 DIAGNOSIS — R61 Generalized hyperhidrosis: Secondary | ICD-10-CM | POA: Diagnosis not present

## 2017-11-12 DIAGNOSIS — I10 Essential (primary) hypertension: Secondary | ICD-10-CM | POA: Diagnosis not present

## 2017-11-12 DIAGNOSIS — Z0000 Encounter for general adult medical examination without abnormal findings: Secondary | ICD-10-CM | POA: Diagnosis not present

## 2017-11-12 DIAGNOSIS — J329 Chronic sinusitis, unspecified: Secondary | ICD-10-CM | POA: Diagnosis not present

## 2017-11-12 DIAGNOSIS — R0681 Apnea, not elsewhere classified: Secondary | ICD-10-CM

## 2017-11-12 MED ORDER — VENLAFAXINE HCL ER 75 MG PO CP24: 75.0000 mg | ORAL_CAPSULE | Freq: Every day | ORAL | 3 refills | Status: DC

## 2017-11-12 MED ORDER — AMLODIPINE BESYLATE 5 MG PO TABS: 5.0000 mg | ORAL_TABLET | Freq: Every day | ORAL | 3 refills | Status: DC

## 2017-11-12 MED ORDER — PROAIR HFA 108 (90 BASE) MCG/ACT IN AERS
2.0000 | INHALATION_SPRAY | Freq: Four times a day (QID) | RESPIRATORY_TRACT | 3 refills | Status: DC | PRN
Start: 2017-11-12 — End: 2018-11-14

## 2017-11-12 MED ORDER — BUDESONIDE 32 MCG/ACT NA SUSP: 2.0000 | Freq: Every day | NASAL | 0 refills | Status: DC

## 2017-11-12 NOTE — Assessment & Plan Note (Signed)
On CPAP. ?

## 2017-11-12 NOTE — Patient Instructions (Addendum)
Please do call to schedule your mammogram; the number to schedule one at either [facility] or [facility] is [phone] ----------------------------------------------------------------    Preventive Care 40-64 Years, Female Preventive care refers to lifestyle choices and visits with your health care provider that can promote health and wellness. What does preventive care include?  A yearly physical exam. This is also called an annual well check.  Dental exams once or twice a year.  Routine eye exams. Ask your health care provider how often you should have your eyes checked.  Personal lifestyle choices, including: ? Daily care of your teeth and gums. ? Regular physical activity. ? Eating a healthy diet. ? Avoiding tobacco and drug use. ? Limiting alcohol use. ? Practicing safe sex. ? Taking low-dose aspirin daily starting at age 50. ? Taking vitamin and mineral supplements as recommended by your health care provider. What happens during an annual well check? The services and screenings done by your health care provider during your annual well check will depend on your age, overall health, lifestyle risk factors, and family history of disease. Counseling Your health care provider may ask you questions about your:  Alcohol use.  Tobacco use.  Drug use.  Emotional well-being.  Home and relationship well-being.  Sexual activity.  Eating habits.  Work and work environment.  Method of birth control.  Menstrual cycle.  Pregnancy history.  Screening You may have the following tests or measurements:  Height, weight, and BMI.  Blood pressure.  Lipid and cholesterol levels. These may be checked every 5 years, or more frequently if you are over 50 years old.  Skin check.  Lung cancer screening. You may have this screening every year starting at age 55 if you have a 30-pack-year history of smoking and currently smoke or have quit within  the past 15 years.  Fecal occult blood test (FOBT) of the stool. You may have this test every year starting at age 50.  Flexible sigmoidoscopy or colonoscopy. You may have a sigmoidoscopy every 5 years or a colonoscopy every 10 years starting at age 50.  Hepatitis C blood test.  Hepatitis B blood test.  Sexually transmitted disease (STD) testing.  Diabetes screening. This is done by checking your blood sugar (glucose) after you have not eaten for a while (fasting). You may have this done every 1-3 years.  Mammogram. This may be done every 1-2 years. Talk to your health care provider about when you should start having regular mammograms. This may depend on whether you have a family history of breast cancer.  BRCA-related cancer screening. This may be done if you have a family history of breast, ovarian, tubal, or peritoneal cancers.  Pelvic exam and Pap test. This may be done every 3 years starting at age 21. Starting at age 30, this may be done every 5 years if you have a Pap test in combination with an HPV test.  Bone density scan. This is done to screen for osteoporosis. You may have this scan if you are at high risk for osteoporosis.  Discuss your test results, treatment options, and if necessary, the need for more tests with your health care provider. Vaccines Your health care provider may recommend certain vaccines, such as:  Influenza vaccine. This is recommended every year.  Tetanus, diphtheria, and acellular pertussis (Tdap, Td) vaccine. You may need a Td booster every 10 years.  Varicella vaccine. You may need this if you have not been vaccinated.  Zoster vaccine.   You may need this after age 60.  Measles, mumps, and rubella (MMR) vaccine. You may need at least one dose of MMR if you were born in 1957 or later. You may also need a second dose.  Pneumococcal 13-valent conjugate (PCV13) vaccine. You may need this if you have certain conditions and were not previously  vaccinated.  Pneumococcal polysaccharide (PPSV23) vaccine. You may need one or two doses if you smoke cigarettes or if you have certain conditions.  Meningococcal vaccine. You may need this if you have certain conditions.  Hepatitis A vaccine. You may need this if you have certain conditions or if you travel or work in places where you may be exposed to hepatitis A.  Hepatitis B vaccine. You may need this if you have certain conditions or if you travel or work in places where you may be exposed to hepatitis B.  Haemophilus influenzae type b (Hib) vaccine. You may need this if you have certain conditions.  Talk to your health care provider about which screenings and vaccines you need and how often you need them. This information is not intended to replace advice given to you by your health care provider. Make sure you discuss any questions you have with your health care provider. Document Released: 11/01/2015 Document Revised: 06/24/2016 Document Reviewed: 08/06/2015 Elsevier Interactive Patient Education  2018 Elsevier Inc.  

## 2017-11-12 NOTE — Progress Notes (Signed)
BP (!) 142/93   Pulse 69   Temp 98.4 F (36.9 C) (Oral)   Ht 4\' 11"  (1.499 m)   Wt 171 lb 8 oz (77.8 kg)   SpO2 98%   BMI 34.64 kg/m    Subjective:    Patient ID: Marissa Baldwin, female    DOB: Mar 14, 1966, 52 y.o.   MRN: [id number]  HPI: Marissa Baldwin is a 52 y.o. female  Chief Complaint  Patient presents with  . Annual Exam   Hypertension Using medications without difficulty Average home BPs "normal at home"  No problems or lightheadedness No chest pain with exertion or shortness of breath No Edema   Hyperlipidemia Last LDL 140.  Refused medication.  Trying to work on her diet but finding problems with fitting a healthy diet in with work The 10-year ASCVD risk score Mikey Bussing DC Brooke Bonito., et al., 2013) is: 2.5%   Values used to calculate the score:     Age: 17 years     Sex: Female     Is Non-Hispanic African American: No     Diabetic: No     Tobacco smoker: No     Systolic Blood Pressure: 884 mmHg     Is BP treated: Yes     HDL Cholesterol: 55 mg/dL     Total Cholesterol: 214 mg/dL  Sinus Problem  This is a chronic (Went to the [facility] on 10/27 for a sinusitis.  Stuffed up ever since.  symptoms come and go.  Takes steroid nasasl spray only occasion) problem. The problem has been waxing and waning since onset. There has been no fever. Associated symptoms include congestion, diaphoresis and shortness of breath. Pertinent negatives include no coughing, ear pain, headaches or sneezing. (Dizzy every so ofte) The treatment provided mild relief.  Asthma  She complains of shortness of breath. There is no cough. The problem occurs intermittently. The problem has been unchanged. Pertinent negatives include no ear pain, headaches or sneezing. Exacerbated by: Can't ID what makes it better or worse. Her symptoms are alleviated by beta-agonist. She reports significant improvement on treatment. Her past medical history is significant for asthma.   Sleep apnea Uses CPAP nightly but notes  problems with RAD and sinus since being on CPAP   Depression screen [facility] 2/9 11/12/2017 05/12/2017 11/16/2016 05/14/2016  Decreased Interest 0 0 0 0  Down, Depressed, Hopeless 0 0 0 0  PHQ - 2 Score 0 0 0 0  Altered sleeping - 3 - -  Tired, decreased energy - 1 - -  Change in appetite - 3 - -  Feeling bad or failure about yourself  - 1 - -  Trouble concentrating - 0 - -  Moving slowly or fidgety/restless - 0 - -  Suicidal thoughts - 0 - -  PHQ-9 Score - 8 - -   Social History   Socioeconomic History  . Marital status: Married    Spouse name: Not on file  . Number of children: Not on file  . Years of education: Not on file  . Highest education level: Not on file  Social Needs  . Financial resource strain: Not on file  . Food insecurity - worry: Not on file  . Food insecurity - inability: Not on file  . Transportation needs - medical: Not on file  . Transportation needs - non-medical: Not on file  Occupational History  . Not on file  Tobacco Use  . Smoking status: Never Smoker  . Smokeless  tobacco: Never Used  Substance and Sexual Activity  . Alcohol use: Yes    Comment: occasional  . Drug use: No  . Sexual activity: Yes  Other Topics Concern  . Not on file  Social History Narrative  . Not on file   Family History  Problem Relation Age of Onset  . Diabetes Father   . Hypertension Father   . Heart disease Father   . Stroke Father        mini strokes  . Neuropathy Father   . Cancer Maternal Aunt        rectal  . Hypertension Maternal Grandmother   . Diabetes Maternal Grandfather   . Hypertension Paternal Grandmother   . Heart disease Paternal Grandfather   . COPD Neg Hx    Past Medical History:  Diagnosis Date  . BPPV (benign paroxysmal positional vertigo)   . Golfers elbow of left upper extremity   . Hyperlipidemia   . Hypertension   . Macular degeneration   . Morton's neuroma of right foot   . Postmenopausal symptoms   . Sleep apnea    CPAP   Past  Surgical History:  Procedure Laterality Date  . COLONOSCOPY WITH PROPOFOL N/A 07/20/2016   Procedure: COLONOSCOPY WITH PROPOFOL;  Surgeon: Lucilla Lame, MD;  Location: [facility];  Service: Endoscopy;  Laterality: N/A;  sleep apnea  . ESOPHAGOGASTRODUODENOSCOPY    . POLYPECTOMY  07/20/2016   Procedure: POLYPECTOMY;  Surgeon: Lucilla Lame, MD;  Location: [facility] [place];  Service: Endoscopy;;    Relevant past medical, surgical, family and social history reviewed and updated as indicated. Interim medical history since our last visit reviewed. Allergies and medications reviewed and updated.  Review of Systems  Constitutional: Positive for diaphoresis.       Complaining of excessive sweating.  LMP 10-11 years ago.  Worse the last 4-5 months  HENT: Positive for congestion. Negative for ear pain and sneezing.   Respiratory: Positive for shortness of breath. Negative for cough.   Cardiovascular: Negative.   Gastrointestinal: Negative.   Genitourinary: Negative.   Musculoskeletal: Negative.   Neurological: Negative for headaches.  Hematological: Negative.   Psychiatric/Behavioral: Negative.     Per HPI unless specifically indicated above     Objective:    BP (!) 142/93   Pulse 69   Temp 98.4 F (36.9 C) (Oral)   Ht 4\' 11"  (1.499 m)   Wt 171 lb 8 oz (77.8 kg)   SpO2 98%   BMI 34.64 kg/m   Wt Readings from Last 3 Encounters:  11/12/17 171 lb 8 oz (77.8 kg)  06/24/17 168 lb (76.2 kg)  05/12/17 166 lb (75.3 kg)    Physical Exam  Constitutional: She is oriented to person, place, and time. She appears well-developed and well-nourished.  HENT:  Head: Normocephalic and atraumatic.  Eyes: Pupils are equal, round, and reactive to light. Right eye exhibits no discharge. Left eye exhibits no discharge. No scleral icterus.  Neck: Normal range of motion. Neck supple. Carotid bruit is not present. No thyromegaly present.  Cardiovascular: Normal rate, regular rhythm and normal  heart sounds. Exam reveals no gallop and no friction rub.  No murmur heard. Pulmonary/Chest: Effort normal and breath sounds normal. No respiratory distress. She has no wheezes. She has no rales.  Abdominal: Soft. Bowel sounds are normal. There is no tenderness. There is no rebound.  Genitourinary: No breast swelling, tenderness or discharge.  Musculoskeletal: Normal range of motion.  Lymphadenopathy:  She has no cervical adenopathy.  Neurological: She is alert and oriented to person, place, and time.  Skin: Skin is warm, dry and intact. No rash noted.  Psychiatric: She has a normal mood and affect. Her speech is normal and behavior is normal. Judgment and thought content normal. Cognition and memory are normal.    Results for orders placed or performed in visit on 05/12/17  Comprehensive metabolic panel  Result Value Ref Range   Glucose 87 65 - 99 mg/dL   BUN 19 6 - 24 mg/dL   Creatinine, Ser 0.54 (L) 0.57 - 1.00 mg/dL   GFR calc non Af Amer 110 >59 mL/min/1.73   GFR calc Af Amer 126 >59 mL/min/1.73   BUN/Creatinine Ratio 35 (H) 9 - 23   Sodium 139 134 - 144 mmol/L   Potassium 4.0 3.5 - 5.2 mmol/L   Chloride 101 96 - 106 mmol/L   CO2 24 20 - 29 mmol/L   Calcium 9.1 8.7 - 10.2 mg/dL   Total Protein 7.5 6.0 - 8.5 g/dL   Albumin 4.5 3.5 - 5.5 g/dL   Globulin, Total 3.0 1.5 - 4.5 g/dL   Albumin/Globulin Ratio 1.5 1.2 - 2.2   Bilirubin Total 0.3 0.0 - 1.2 mg/dL   Alkaline Phosphatase 109 39 - 117 IU/L   AST 29 0 - 40 IU/L   ALT 32 0 - 32 IU/L  Bayer DCA Hb A1c Waived  Result Value Ref Range   Bayer DCA Hb A1c Waived 5.9 <7.0 %  TSH  Result Value Ref Range   TSH 1.070 0.450 - 4.500 uIU/mL  Lipid Panel w/o Chol/HDL Ratio  Result Value Ref Range   Cholesterol, Total 214 (H) 100 - 199 mg/dL   Triglycerides 97 0 - 149 mg/dL   HDL 55 >39 mg/dL   VLDL Cholesterol Cal 19 5 - 40 mg/dL   LDL Calculated 140 (H) 0 - 99 mg/dL      Assessment & Plan:   Problem List Items Addressed  This Visit      Unprioritized   Apnea    On CPAP      Asthma    Continue with occasional ProAir      Relevant Medications   PROAIR HFA 108 (90 Base) MCG/ACT inhaler   Chronic rhinosinusitis    Use steroid nasal spray on a daily basis.      Relevant Medications   budesonide (RHINOCORT AQUA) 32 MCG/ACT nasal spray   Depression, major, single episode, complete remission (HCC)    Stable on Effexor.        Relevant Medications   venlafaxine XR (EFFEXOR-XR) 75 MG 24 hr capsule   Essential hypertension, benign    High today, but stable numbers at home      Relevant Medications   amLODipine (NORVASC) 5 MG tablet   Other Relevant Orders   Comprehensive metabolic panel   Lipid Panel w/o Chol/HDL Ratio   Obesity (BMI 30-39.9)    Encourage to exercise.  Working on eating better       Other Visit Diagnoses    Annual physical exam    -  Primary   Relevant Orders   CBC with Differential/Platelet   MM DIGITAL SCREENING BILATERAL   Diaphoresis       Relevant Orders   TSH       Follow up plan: Return in about 6 months (around 05/12/2018).

## 2017-11-12 NOTE — Assessment & Plan Note (Signed)
Stable on Effexor

## 2017-11-12 NOTE — Assessment & Plan Note (Signed)
Encourage to exercise.  Working on eating better

## 2017-11-12 NOTE — Assessment & Plan Note (Signed)
Use steroid nasal spray on a daily basis.

## 2017-11-12 NOTE — Assessment & Plan Note (Signed)
High today, but stable numbers at home

## 2017-11-12 NOTE — Assessment & Plan Note (Signed)
Continue with occasional ProAir

## 2017-11-13 LAB — COMPREHENSIVE METABOLIC PANEL
ALK PHOS: 118 IU/L — AB (ref 39–117)
ALT: 99 IU/L — AB (ref 0–32)
AST: 61 IU/L — ABNORMAL HIGH (ref 0–40)
Albumin/Globulin Ratio: 1.4 (ref 1.2–2.2)
Albumin: 4.6 g/dL (ref 3.5–5.5)
BUN/Creatinine Ratio: 28 — ABNORMAL HIGH (ref 9–23)
BUN: 17 mg/dL (ref 6–24)
Bilirubin Total: 0.3 mg/dL (ref 0.0–1.2)
CALCIUM: 9.6 mg/dL (ref 8.7–10.2)
CO2: 24 mmol/L (ref 20–29)
CREATININE: 0.6 mg/dL (ref 0.57–1.00)
Chloride: 101 mmol/L (ref 96–106)
GFR calc Af Amer: 122 mL/min/{1.73_m2} (ref >59)
GFR, EST NON AFRICAN AMERICAN: 106 mL/min/{1.73_m2} (ref >59)
GLUCOSE: 140 mg/dL — AB (ref 65–99)
Globulin, Total: 3.2 g/dL (ref 1.5–4.5)
Potassium: 4.1 mmol/L (ref 3.5–5.2)
Sodium: 140 mmol/L (ref 134–144)
Total Protein: 7.8 g/dL (ref 6.0–8.5)

## 2017-11-13 LAB — CBC WITH DIFFERENTIAL/PLATELET
BASOS ABS: 0 10*3/uL (ref 0.0–0.2)
Basos: 0 %
EOS (ABSOLUTE): 0 10*3/uL (ref 0.0–0.4)
EOS: 0 %
Hematocrit: 41.9 % (ref 34.0–46.6)
Hemoglobin: 14.1 g/dL (ref 11.1–15.9)
IMMATURE GRANULOCYTES: 0 %
Immature Grans (Abs): 0 10*3/uL (ref 0.0–0.1)
Lymphocytes Absolute: 2.5 10*3/uL (ref 0.7–3.1)
Lymphs: 45 %
MCH: 29.9 pg (ref 26.6–33.0)
MCHC: 33.7 g/dL (ref 31.5–35.7)
MCV: 89 fL (ref 79–97)
Monocytes Absolute: 0.3 10*3/uL (ref 0.1–0.9)
Monocytes: 5 %
NEUTROS PCT: 50 %
Neutrophils Absolute: 2.8 10*3/uL (ref 1.4–7.0)
PLATELETS: 311 10*3/uL (ref 150–379)
RBC: 4.72 x10E6/uL (ref 3.77–5.28)
RDW: 13.4 % (ref 12.3–15.4)
WBC: 5.6 10*3/uL (ref 3.4–10.8)

## 2017-11-13 LAB — TSH: TSH: 0.829 u[IU]/mL (ref 0.450–4.500)

## 2017-11-13 LAB — LIPID PANEL W/O CHOL/HDL RATIO
CHOLESTEROL TOTAL: 226 mg/dL — AB (ref 100–199)
HDL: 52 mg/dL (ref >39)
LDL CALC: 152 mg/dL — AB (ref 0–99)
Triglycerides: 108 mg/dL (ref 0–149)
VLDL Cholesterol Cal: 22 mg/dL (ref 5–40)

## 2017-11-15 ENCOUNTER — Telehealth: Payer: Self-pay | Admitting: Unknown Physician Specialty

## 2017-11-15 DIAGNOSIS — R945 Abnormal results of liver function studies: Secondary | ICD-10-CM

## 2017-11-15 DIAGNOSIS — R7989 Other specified abnormal findings of blood chemistry: Secondary | ICD-10-CM

## 2017-11-15 NOTE — Telephone Encounter (Signed)
[place], I could not get through today  Can you call her and let her know that her liver enzymes are elevated more than previous? Can she come in and get some additional labs?

## 2017-11-16 ENCOUNTER — Other Ambulatory Visit: Payer: BLUE CROSS/BLUE SHIELD

## 2017-11-16 DIAGNOSIS — R7989 Other specified abnormal findings of blood chemistry: Secondary | ICD-10-CM

## 2017-11-16 DIAGNOSIS — R945 Abnormal results of liver function studies: Secondary | ICD-10-CM

## 2017-11-16 NOTE — Telephone Encounter (Signed)
Called and spoke to patient. I let her know what Malachy Mood said and she is going to come by today for additional labs.

## 2017-11-17 ENCOUNTER — Encounter: Payer: Self-pay | Admitting: Unknown Physician Specialty

## 2017-11-17 LAB — HEPATITIS PANEL, ACUTE
Hep A IgM: NEGATIVE
Hep B C IgM: NEGATIVE
Hepatitis B Surface Ag: NEGATIVE

## 2017-11-17 LAB — GAMMA GT: GGT: 231 IU/L — AB (ref 0–60)

## 2017-11-19 ENCOUNTER — Other Ambulatory Visit: Payer: Self-pay | Admitting: Unknown Physician Specialty

## 2017-12-13 ENCOUNTER — Encounter: Payer: Self-pay | Admitting: Unknown Physician Specialty

## 2017-12-20 ENCOUNTER — Ambulatory Visit
Admission: RE | Admit: 2017-12-20 | Discharge: 2017-12-20 | Disposition: A | Discharge status: Home / Self Care | Payer: BLUE CROSS/BLUE SHIELD | Source: Ambulatory Visit | Attending: Unknown Physician Specialty | Admitting: Unknown Physician Specialty

## 2017-12-20 DIAGNOSIS — Z0000 Encounter for general adult medical examination without abnormal findings: Secondary | ICD-10-CM | POA: Diagnosis not present

## 2017-12-20 DIAGNOSIS — Z1231 Encounter for screening mammogram for malignant neoplasm of breast: Secondary | ICD-10-CM | POA: Diagnosis not present

## 2017-12-22 ENCOUNTER — Ambulatory Visit: Payer: Self-pay | Admitting: Unknown Physician Specialty

## 2018-01-05 ENCOUNTER — Encounter: Payer: Self-pay | Admitting: Unknown Physician Specialty

## 2018-01-05 MED ORDER — VENLAFAXINE HCL ER 75 MG PO CP24: 75.0000 mg | ORAL_CAPSULE | Freq: Every day | ORAL | 3 refills | Status: DC

## 2018-02-14 ENCOUNTER — Ambulatory Visit: Payer: Self-pay | Admitting: Unknown Physician Specialty

## 2018-03-24 DIAGNOSIS — G4733 Obstructive sleep apnea (adult) (pediatric): Secondary | ICD-10-CM | POA: Diagnosis not present

## 2018-04-18 DIAGNOSIS — F32A Depression, unspecified: Secondary | ICD-10-CM

## 2018-04-18 HISTORY — DX: Depression, unspecified: F32.A

## 2018-05-12 ENCOUNTER — Encounter: Payer: Self-pay | Admitting: Unknown Physician Specialty

## 2018-05-13 ENCOUNTER — Encounter: Payer: Self-pay | Admitting: Unknown Physician Specialty

## 2018-05-13 ENCOUNTER — Ambulatory Visit (INDEPENDENT_AMBULATORY_CARE_PROVIDER_SITE_OTHER): Payer: BLUE CROSS/BLUE SHIELD | Admitting: Unknown Physician Specialty

## 2018-05-13 VITALS — BP 122/79 | HR 66 | Temp 98.2°F | Ht 59.0 in | Wt 173.8 lb

## 2018-05-13 DIAGNOSIS — Z789 Other specified health status: Secondary | ICD-10-CM

## 2018-05-13 DIAGNOSIS — R748 Abnormal levels of other serum enzymes: Secondary | ICD-10-CM

## 2018-05-13 DIAGNOSIS — M791 Myalgia, unspecified site: Secondary | ICD-10-CM | POA: Insufficient documentation

## 2018-05-13 DIAGNOSIS — E669 Obesity, unspecified: Secondary | ICD-10-CM

## 2018-05-13 DIAGNOSIS — R945 Abnormal results of liver function studies: Secondary | ICD-10-CM

## 2018-05-13 DIAGNOSIS — F5101 Primary insomnia: Secondary | ICD-10-CM

## 2018-05-13 DIAGNOSIS — F324 Major depressive disorder, single episode, in partial remission: Secondary | ICD-10-CM

## 2018-05-13 DIAGNOSIS — J4520 Mild intermittent asthma, uncomplicated: Secondary | ICD-10-CM

## 2018-05-13 DIAGNOSIS — I10 Essential (primary) hypertension: Secondary | ICD-10-CM | POA: Diagnosis not present

## 2018-05-13 DIAGNOSIS — R7989 Other specified abnormal findings of blood chemistry: Secondary | ICD-10-CM

## 2018-05-13 DIAGNOSIS — R7401 Elevation of levels of liver transaminase levels: Secondary | ICD-10-CM

## 2018-05-13 DIAGNOSIS — M722 Plantar fascial fibromatosis: Secondary | ICD-10-CM | POA: Insufficient documentation

## 2018-05-13 DIAGNOSIS — T466X5A Adverse effect of antihyperlipidemic and antiarteriosclerotic drugs, initial encounter: Secondary | ICD-10-CM | POA: Insufficient documentation

## 2018-05-13 DIAGNOSIS — R740 Nonspecific elevation of levels of transaminase and lactic acid dehydrogenase [LDH]: Secondary | ICD-10-CM | POA: Diagnosis not present

## 2018-05-13 MED ORDER — VENLAFAXINE HCL ER 150 MG PO CP24: 150.0000 mg | ORAL_CAPSULE | Freq: Every day | ORAL | 0 refills | Status: DC

## 2018-05-13 NOTE — Assessment & Plan Note (Signed)
Not to goal.  Increase Effexor to 150 mg.  Recheck in 1 month

## 2018-05-13 NOTE — Patient Instructions (Signed)
Plantar Fasciitis Plantar fasciitis is a painful foot condition that affects the heel. It occurs when the band of tissue that connects the toes to the heel bone (plantar fascia) becomes irritated. This can happen after exercising too much or doing other repetitive activities (overuse injury). The pain from plantar fasciitis can range from mild irritation to severe pain that makes it difficult for you to walk or move. The pain is usually worse in the morning or after you have been sitting or lying down for a while. What are the causes? This condition may be caused by:  Standing for long periods of time.  Wearing shoes that do not fit.  Doing high-impact activities, including running, aerobics, and ballet.  Being overweight.  Having an abnormal way of walking (gait).  Having tight calf muscles.  Having high arches in your feet.  Starting a new athletic activity.  What are the signs or symptoms? The main symptom of this condition is heel pain. Other symptoms include:  Pain that gets worse after activity or exercise.  Pain that is worse in the morning or after resting.  Pain that goes away after you walk for a few minutes.  How is this diagnosed? This condition may be diagnosed based on your signs and symptoms. Your health care provider will also do a physical exam to check for:  A tender area on the bottom of your foot.  A high arch in your foot.  Pain when you move your foot.  Difficulty moving your foot.  You may also need to have imaging studies to confirm the diagnosis. These can include:  X-rays.  Ultrasound.  MRI.  How is this treated? Treatment for plantar fasciitis depends on the severity of the condition. Your treatment may include:  Rest, ice, and over-the-counter pain medicines to manage your pain.  Exercises to stretch your calves and your plantar fascia.  A splint that holds your foot in a stretched, upward position while you sleep (night  splint).  Physical therapy to relieve symptoms and prevent problems in the future.  Cortisone injections to relieve severe pain.  Extracorporeal shock wave therapy (ESWT) to stimulate damaged plantar fascia with electrical impulses. It is often used as a last resort before surgery.  Surgery, if other treatments have not worked after 12 months.  Follow these instructions at home:  Take medicines only as directed by your health care provider.  Avoid activities that cause pain.  Roll the bottom of your foot over a bag of ice or a bottle of cold water. Do this for 20 minutes, 3-4 times a day.  Perform simple stretches as directed by your health care provider.  Try wearing athletic shoes with air-sole or gel-sole cushions or soft shoe inserts.  Wear a night splint while sleeping, if directed by your health care provider.  Keep all follow-up appointments with your health care provider. How is this prevented?  Do not perform exercises or activities that cause heel pain.  Consider finding low-impact activities if you continue to have problems.  Lose weight if you need to. The best way to prevent plantar fasciitis is to avoid the activities that aggravate your plantar fascia. Contact a health care provider if:  Your symptoms do not go away after treatment with home care measures.  Your pain gets worse.  Your pain affects your ability to move or do your daily activities. This information is not intended to replace advice given to you by your health care provider. Make sure you   discuss any questions you have with your health care provider. Document Released: 06/30/2001 Document Revised: 03/09/2016 Document Reviewed: 08/15/2014 Elsevier Interactive Patient Education  2018 [employer].  Plantar Fasciitis Rehab Ask your health care provider which exercises are safe for you. Do exercises exactly as told by your health care provider and adjust them as directed. It is normal to feel  mild stretching, pulling, tightness, or discomfort as you do these exercises, but you should stop right away if you feel sudden pain or your pain gets worse. Do not begin these exercises until told by your health care provider. Stretching and range of motion exercises These exercises warm up your muscles and joints and improve the movement and flexibility of your foot. These exercises also help to relieve pain. Exercise A: Plantar fascia stretch  1. Sit with your left / right leg crossed over your opposite knee. 2. Hold your heel with one hand with that thumb near your arch. With your other hand, hold your toes and gently pull them back toward the top of your foot. You should feel a stretch on the bottom of your toes or your foot or both. 3. Hold this stretch for__________ seconds. 4. Slowly release your toes and return to the starting position. Repeat __________ times. Complete this exercise __________ times a day. Exercise B: Gastroc, standing  1. Stand with your hands against a wall. 2. Extend your left / right leg behind you, and bend your front knee slightly. 3. Keeping your heels on the floor and keeping your back knee straight, shift your weight toward the wall without arching your back. You should feel a gentle stretch in your left / right calf. 4. Hold this position for __________ seconds. Repeat __________ times. Complete this exercise __________ times a day. Exercise C: Soleus, standing 1. Stand with your hands against a wall. 2. Extend your left / right leg behind you, and bend your front knee slightly. 3. Keeping your heels on the floor, bend your back knee and slightly shift your weight over the back leg. You should feel a gentle stretch deep in your calf. 4. Hold this position for __________ seconds. Repeat __________ times. Complete this exercise __________ times a day. Exercise D: Gastrocsoleus, standing 1. Stand with the ball of your left / right foot on a step. The ball of  your foot is on the walking surface, right under your toes. 2. Keep your other foot firmly on the same step. 3. Hold onto the wall or a railing for balance. 4. Slowly lift your other foot, allowing your body weight to press your heel down over the edge of the step. You should feel a stretch in your left / right calf. 5. Hold this position for __________ seconds. 6. Return both feet to the step. 7. Repeat this exercise with a slight bend in your left / right knee. Repeat __________ times with your left / right knee straight and __________ times with your left / right knee bent. Complete this exercise __________ times a day. Balance exercise This exercise builds your balance and strength control of your arch to help take pressure off your plantar fascia. Exercise E: Single leg stand 1. Without shoes, stand near a railing or in a doorway. You may hold onto the railing or door frame as needed. 2. Stand on your left / right foot. Keep your big toe down on the floor and try to keep your arch lifted. Do not let your foot roll inward. 3. Hold this  position for __________ seconds. 4. If this exercise is too easy, you can try it with your eyes closed or while standing on a pillow. Repeat __________ times. Complete this exercise __________ times a day. This information is not intended to replace advice given to you by your health care provider. Make sure you discuss any questions you have with your health care provider. Document Released: 10/05/2005 Document Revised: 06/09/2016 Document Reviewed: 08/19/2015 Elsevier Interactive Patient Education  2018 [employer].

## 2018-05-13 NOTE — Assessment & Plan Note (Signed)
Discussed exercises.  Planning on swimming.  Increase Effexor

## 2018-05-13 NOTE — Assessment & Plan Note (Signed)
Stable, continue present medications.   

## 2018-05-13 NOTE — Assessment & Plan Note (Signed)
Elevated with low risk score.  Consider Zetia at her PE

## 2018-05-13 NOTE — Progress Notes (Signed)
BP 122/79   Pulse 66   Temp 98.2 F (36.8 C) (Oral)   Ht 4\' 11"  (1.499 m)   Wt 173 lb 12.8 oz (78.8 kg)   SpO2 96%   BMI 35.10 kg/m    Subjective:    Patient ID: Marissa Baldwin, female    DOB: 25-May-1966, 52 y.o.   MRN: [id number]  HPI: Marissa Baldwin is a 52 y.o. female  Chief Complaint  Patient presents with  . Hyperlipidemia  . Hypertension    Elevated liver enzymes Lost to 3 month f/u for elevated liver enzymes.    Depression Pt states she is frustrated much of the time, especially with weight loss efforts. Admits to overeating due to boredom or stress.  Worried about her dog who has seizures.  Not sleeping well Depression screen [facility] 2/9 05/13/2018 11/12/2017 05/12/2017 11/16/2016 05/14/2016  Decreased Interest 1 0 0 0 0  Down, Depressed, Hopeless 1 0 0 0 0  PHQ - 2 Score 2 0 0 0 0  Altered sleeping 2 - 3 - -  Tired, decreased energy 2 - 1 - -  Change in appetite 3 - 3 - -  Feeling bad or failure about yourself  0 - 1 - -  Trouble concentrating 0 - 0 - -  Moving slowly or fidgety/restless 2 - 0 - -  Suicidal thoughts 0 - 0 - -  PHQ-9 Score 11 - 8 - -   Hypertension Using medications without difficulty Average home BPs not checking  No problems or lightheadedness No chest pain with exertion or shortness of breath No Edema  The 10-year ASCVD risk score Mikey Bussing DC Jr., et al., 2013) is: 2.3%   Values used to calculate the score:     Age: 35 years     Sex: Female     Is Non-Hispanic African American: No     Diabetic: No     Tobacco smoker: No     Systolic Blood Pressure: 846 mmHg     Is BP treated: Yes     HDL Cholesterol: 52 mg/dL     Total Cholesterol: 226 mg/dL   Foot pain Pt states she is having pain for about 2 weeks.  Comes and goes.  She is doing exercises.  Hurts worse with first step in the AM.  Spends much of the day on her feet.    Relevant past medical, surgical, family and social history reviewed and updated as indicated. Interim medical history since  our last visit reviewed. Allergies and medications reviewed and updated.  Review of Systems  Per HPI unless specifically indicated above     Objective:    BP 122/79   Pulse 66   Temp 98.2 F (36.8 C) (Oral)   Ht 4\' 11"  (1.499 m)   Wt 173 lb 12.8 oz (78.8 kg)   SpO2 96%   BMI 35.10 kg/m   Wt Readings from Last 3 Encounters:  05/13/18 173 lb 12.8 oz (78.8 kg)  11/12/17 171 lb 8 oz (77.8 kg)  06/24/17 168 lb (76.2 kg)    Physical Exam  Constitutional: She is oriented to person, place, and time. She appears well-developed and well-nourished. No distress.  HENT:  Head: Normocephalic and atraumatic.  Eyes: Conjunctivae and lids are normal. Right eye exhibits no discharge. Left eye exhibits no discharge. No scleral icterus.  Neck: Normal range of motion. Neck supple. No JVD present. Carotid bruit is not present.  Cardiovascular: Normal rate, regular rhythm and normal  heart sounds.  Pulmonary/Chest: Effort normal and breath sounds normal.  Abdominal: Normal appearance. There is no splenomegaly or hepatomegaly.  Musculoskeletal: Normal range of motion.  Neurological: She is alert and oriented to person, place, and time.  Skin: Skin is warm, dry and intact. No rash noted. No pallor.  Psychiatric: She has a normal mood and affect. Her behavior is normal. Judgment and thought content normal.    Results for orders placed or performed in visit on 11/16/17  Hepatitis, Acute  Result Value Ref Range   Hep A IgM Negative Negative   Hepatitis B Surface Ag Negative Negative   Hep B C IgM Negative Negative   Hep C Virus Ab <0.1 0.0 - 0.9 s/co ratio  Gamma GT  Result Value Ref Range   GGT 231 (H) 0 - 60 IU/L      Assessment & Plan:   Problem List Items Addressed This Visit      Unprioritized   Abnormal liver function test    Consider [place] with continued elevations.        Asthma    Stable, continue present medications.        Depression, major, single episode, in partial  remission ([place])    Not to goal.  Increase Effexor to 150 mg.  Recheck in 1 month      Relevant Medications   venlafaxine XR (EFFEXOR-XR) 150 MG 24 hr capsule   Elevated alkaline phosphatase level - Primary   Relevant Orders   Comprehensive metabolic panel   Essential hypertension, benign    Stable, continue present medications.        Relevant Orders   Lipid Panel w/o Chol/HDL Ratio   Insomnia    Discussed exercises.  Planning on swimming.  Increase Effexor      Obesity (BMI 30-39.9)    Currently frustrated with weight gain.  Encouraged increasing vegetables.        Plantar fasciitis    Discussed exercises.  NSAIDs.  Print out given      Statin intolerance    Elevated with low risk score.  Consider Zetia at her PE       Other Visit Diagnoses    Elevated transaminase level       Relevant Orders   Comprehensive metabolic panel   Ferritin       Follow up plan: Return in about 1 month (around 06/10/2018) for physical.

## 2018-05-13 NOTE — Assessment & Plan Note (Signed)
Discussed exercises.  NSAIDs.  Print out given

## 2018-05-13 NOTE — Assessment & Plan Note (Signed)
Consider [place] with continued elevations.

## 2018-05-13 NOTE — Assessment & Plan Note (Signed)
Currently frustrated with weight gain.  Encouraged increasing vegetables.

## 2018-05-14 LAB — LIPID PANEL W/O CHOL/HDL RATIO
Cholesterol, Total: 234 mg/dL — ABNORMAL HIGH (ref 100–199)
HDL: 55 mg/dL (ref >39)
LDL CALC: 158 mg/dL — AB (ref 0–99)
Triglycerides: 106 mg/dL (ref 0–149)
VLDL CHOLESTEROL CAL: 21 mg/dL (ref 5–40)

## 2018-05-14 LAB — COMPREHENSIVE METABOLIC PANEL
ALBUMIN: 4.7 g/dL (ref 3.5–5.5)
ALT: 98 IU/L — ABNORMAL HIGH (ref 0–32)
AST: 66 IU/L — ABNORMAL HIGH (ref 0–40)
Albumin/Globulin Ratio: 1.8 (ref 1.2–2.2)
Alkaline Phosphatase: 111 IU/L (ref 39–117)
BUN / CREAT RATIO: 27 — AB (ref 9–23)
BUN: 15 mg/dL (ref 6–24)
Bilirubin Total: 0.6 mg/dL (ref 0.0–1.2)
CALCIUM: 9.3 mg/dL (ref 8.7–10.2)
CO2: 23 mmol/L (ref 20–29)
CREATININE: 0.56 mg/dL — AB (ref 0.57–1.00)
Chloride: 103 mmol/L (ref 96–106)
GFR calc Af Amer: 124 mL/min/{1.73_m2} (ref >59)
GFR, EST NON AFRICAN AMERICAN: 108 mL/min/{1.73_m2} (ref >59)
GLOBULIN, TOTAL: 2.6 g/dL (ref 1.5–4.5)
Glucose: 143 mg/dL — ABNORMAL HIGH (ref 65–99)
Potassium: 4.3 mmol/L (ref 3.5–5.2)
Sodium: 142 mmol/L (ref 134–144)
TOTAL PROTEIN: 7.3 g/dL (ref 6.0–8.5)

## 2018-05-14 LAB — FERRITIN: FERRITIN: 179 ng/mL — AB (ref 15–150)

## 2018-05-16 ENCOUNTER — Encounter: Payer: Self-pay | Admitting: Unknown Physician Specialty

## 2018-05-16 ENCOUNTER — Telehealth: Payer: Self-pay | Admitting: Unknown Physician Specialty

## 2018-05-16 NOTE — Progress Notes (Signed)
Discussed with pt about elevated liver enzymes.  Refusing [place] due to money issues.  She will be back next month.  Recheck at that time and check Hepatitis labs, and consider [place].  Discussed high cholesterol and statin benefit on ASCVD.  Also needs CBC

## 2018-05-16 NOTE — Telephone Encounter (Signed)
Discussed with pt about elevated liver enzymes.  Refusing [place] due to money issues.  She will be back next month.  Recheck at that time and check Hepatitis labs, and consider [place].

## 2018-06-14 ENCOUNTER — Ambulatory Visit (INDEPENDENT_AMBULATORY_CARE_PROVIDER_SITE_OTHER): Payer: BLUE CROSS/BLUE SHIELD | Admitting: Family Medicine

## 2018-06-14 ENCOUNTER — Encounter: Payer: Self-pay | Admitting: Family Medicine

## 2018-06-14 ENCOUNTER — Other Ambulatory Visit: Payer: Self-pay

## 2018-06-14 VITALS — BP 129/89 | HR 70 | Temp 98.8°F | Ht 59.0 in | Wt 173.5 lb

## 2018-06-14 DIAGNOSIS — M722 Plantar fascial fibromatosis: Secondary | ICD-10-CM

## 2018-06-14 DIAGNOSIS — E782 Mixed hyperlipidemia: Secondary | ICD-10-CM | POA: Diagnosis not present

## 2018-06-14 DIAGNOSIS — F324 Major depressive disorder, single episode, in partial remission: Secondary | ICD-10-CM

## 2018-06-14 MED ORDER — VENLAFAXINE HCL ER 150 MG PO CP24: 150.0000 mg | ORAL_CAPSULE | Freq: Every day | ORAL | 1 refills | Status: DC

## 2018-06-14 MED ORDER — EZETIMIBE 10 MG PO TABS: 10.0000 mg | ORAL_TABLET | Freq: Every day | ORAL | 1 refills | Status: DC

## 2018-06-14 NOTE — Progress Notes (Signed)
BP 129/89   Pulse 70   Temp 98.8 F (37.1 C) (Oral)   Ht 4\' 11"  (1.499 m)   Wt 173 lb 8 oz (78.7 kg)   SpO2 98%   BMI 35.04 kg/m    Subjective:    Patient ID: Marissa Baldwin, female    DOB: 01-13-1966, 52 y.o.   MRN: [id number]  HPI: Marissa Baldwin is a 52 y.o. female  Chief Complaint  Patient presents with  . Depression    17m f/u   Here today for 1 month f/u moods . Effexor was increased to 150 mg at last visit. Does not notice a huge difference since the dose change, but feels moods are under good control.   Plantar fasciitis slowly improving with stretches, rolling, ice. Still quite painful most mornings. Using NSAIDs prn.   Is intolerant to statins, and zetia was discussed last month as an alternative. Pt would like to start the medication. Also trying to make good lifestyle changes as well.   Past Medical History:  Diagnosis Date  . BPPV (benign paroxysmal positional vertigo)   . Golfers elbow of left upper extremity   . Hyperlipidemia   . Hypertension   . Macular degeneration   . Morton's neuroma of right foot   . Postmenopausal symptoms   . Sleep apnea    CPAP   Social History   Socioeconomic History  . Marital status: Married    Spouse name: Not on file  . Number of children: Not on file  . Years of education: Not on file  . Highest education level: Not on file  Occupational History  . Not on file  Social Needs  . Financial resource strain: Not on file  . Food insecurity:    Worry: Not on file    Inability: Not on file  . Transportation needs:    Medical: Not on file    Non-medical: Not on file  Tobacco Use  . Smoking status: Never Smoker  . Smokeless tobacco: Never Used  Substance and Sexual Activity  . Alcohol use: Yes    Comment: occasional  . Drug use: No  . Sexual activity: Yes  Lifestyle  . Physical activity:    Days per week: Not on file    Minutes per session: Not on file  . Stress: Not on file  Relationships  . Social connections:    Talks on phone: Not on file    Gets together: Not on file    Attends religious service: Not on file    Active member of club or organization: Not on file    Attends meetings of clubs or organizations: Not on file    Relationship status: Not on file  . Intimate partner violence:    Fear of current or ex partner: Not on file    Emotionally abused: Not on file    Physically abused: Not on file    Forced sexual activity: Not on file  Other Topics Concern  . Not on file  Social History Narrative  . Not on file    Relevant past medical, surgical, family and social history reviewed and updated as indicated. Interim medical history since our last visit reviewed. Allergies and medications reviewed and updated.  Review of Systems  Per HPI unless specifically indicated above     Objective:    BP 129/89   Pulse 70   Temp 98.8 F (37.1 C) (Oral)   Ht 4\' 11"  (1.499 m)   Wt  173 lb 8 oz (78.7 kg)   SpO2 98%   BMI 35.04 kg/m   Wt Readings from Last 3 Encounters:  06/14/18 173 lb 8 oz (78.7 kg)  05/13/18 173 lb 12.8 oz (78.8 kg)  11/12/17 171 lb 8 oz (77.8 kg)    Physical Exam  Constitutional: She is oriented to person, place, and time. She appears well-developed and well-nourished. No distress.  HENT:  Head: Atraumatic.  Eyes: Pupils are equal, round, and reactive to light. Conjunctivae are normal. No scleral icterus.  Neck: Normal range of motion. Neck supple.  Cardiovascular: Normal rate, regular rhythm, normal heart sounds and intact distal pulses.  Pulmonary/Chest: Effort normal and breath sounds normal. No respiratory distress.  Musculoskeletal: Normal range of motion. She exhibits no edema or tenderness.  Neurological: She is alert and oriented to person, place, and time. No cranial nerve deficit.  Skin: Skin is warm and dry.  Psychiatric: She has a normal mood and affect. Her behavior is normal.  Nursing note and vitals reviewed.   Results for orders placed or performed  in visit on 05/13/18  Comprehensive metabolic panel  Result Value Ref Range   Glucose 143 (H) 65 - 99 mg/dL   BUN 15 6 - 24 mg/dL   Creatinine, Ser 0.56 (L) 0.57 - 1.00 mg/dL   GFR calc non Af Amer 108 >59 mL/min/1.73   GFR calc Af Amer 124 >59 mL/min/1.73   BUN/Creatinine Ratio 27 (H) 9 - 23   Sodium 142 134 - 144 mmol/L   Potassium 4.3 3.5 - 5.2 mmol/L   Chloride 103 96 - 106 mmol/L   CO2 23 20 - 29 mmol/L   Calcium 9.3 8.7 - 10.2 mg/dL   Total Protein 7.3 6.0 - 8.5 g/dL   Albumin 4.7 3.5 - 5.5 g/dL   Globulin, Total 2.6 1.5 - 4.5 g/dL   Albumin/Globulin Ratio 1.8 1.2 - 2.2   Bilirubin Total 0.6 0.0 - 1.2 mg/dL   Alkaline Phosphatase 111 39 - 117 IU/L   AST 66 (H) 0 - 40 IU/L   ALT 98 (H) 0 - 32 IU/L  Lipid Panel w/o Chol/HDL Ratio  Result Value Ref Range   Cholesterol, Total 234 (H) 100 - 199 mg/dL   Triglycerides 106 0 - 149 mg/dL   HDL 55 >39 mg/dL   VLDL Cholesterol Cal 21 5 - 40 mg/dL   LDL Calculated 158 (H) 0 - 99 mg/dL  Ferritin  Result Value Ref Range   Ferritin 179 (H) 15 - 150 ng/mL      Assessment & Plan:   Problem List Items Addressed This Visit      Musculoskeletal and Integument   Plantar fasciitis - Primary    Improving, recommended night braces for further relief. Ibuprofen, epsom salt soaks, stretches, rolling prn        Other   Hyperlipidemia    Start zetia. Recheck in 6 months. Continue working on lifestyle modifications      Relevant Medications   ezetimibe (ZETIA) 10 MG tablet   Depression, major, single episode, in partial remission ([facility])    Stable on 150 mg effexor. Continue current regimen      Relevant Medications   venlafaxine XR (EFFEXOR-XR) 150 MG 24 hr capsule       Follow up plan: Return for CPE after 1/25.

## 2018-06-14 NOTE — Patient Instructions (Addendum)
Evening primrose oil Hyaluronic acid  Nonalcoholic Fatty Liver Disease Diet Nonalcoholic fatty liver disease is a condition that causes fat to accumulate in and around the liver. The disease makes it harder for the liver to work the way that it should. Following a healthy diet can help to keep nonalcoholic fatty liver disease under control. It can also help to prevent or improve conditions that are associated with the disease, such as heart disease, diabetes, high blood pressure, and abnormal cholesterol levels. Along with regular exercise, this diet:  Promotes weight loss.  Helps to control blood sugar levels.  Helps to improve the way that the body uses insulin.  What do I need to know about this diet?  Use the glycemic index (GI) to plan your meals. The index tells you how quickly a food will raise your blood sugar. Choose low-GI foods. These foods take a longer time to raise blood sugar.  Keep track of how many calories you take in. Eating the right amount of calories will help you to achieve a healthy weight.  You may want to follow a Mediterranean diet. This diet includes a lot of vegetables, lean meats or fish, whole grains, fruits, and healthy oils and fats. What foods can I eat? Grains Whole grains, such as whole-wheat or whole-grain breads, crackers, tortillas, cereals, and pasta. Stone-ground whole wheat. Pumpernickel bread. Unsweetened oatmeal. Bulgur. Barley. Quinoa. Brown or wild rice. Corn or whole-wheat flour tortillas. Vegetables Lettuce. Spinach. Peas. Beets. Cauliflower. Cabbage. Broccoli. Carrots. Tomatoes. Squash. Eggplant. Herbs. Peppers. Onions. Cucumbers. Brussels sprouts. Yams and sweet potatoes. Beans. Lentils. Fruits Bananas. Apples. Oranges. Grapes. Papaya. Mango. Pomegranate. Kiwi. Grapefruit. Cherries. Meats and Other Protein Sources Seafood and shellfish. Lean meats. Poultry. Tofu. Dairy Low-fat or fat-free dairy products, such as yogurt, cottage cheese,  and cheese. Beverages Water. Sugar-free drinks. Tea. Coffee. Low-fat or skim milk. Milk alternatives, such as soy or almond milk. Real fruit juice. Condiments Mustard. Relish. Low-fat, low-sugar ketchup and barbecue sauce. Low-fat or fat-free mayonnaise. Sweets and Desserts Sugar-free sweets. Fats and Oils Avocado. Canola or olive oil. Nuts and nut butters. Seeds. The items listed above may not be a complete list of recommended foods or beverages. Contact your dietitian for more options. What foods are not recommended? Palm oil and coconut oil. Processed foods. Fried foods. Sweetened drinks, such as sweet tea, milkshakes, snow cones, iced sweet drinks, and sodas. Alcohol. Sweets. Foods that contain a lot of salt or sodium. The items listed above may not be a complete list of foods and beverages to avoid. Contact your dietitian for more information. This information is not intended to replace advice given to you by your health care provider. Make sure you discuss any questions you have with your health care provider. Document Released: 02/19/2015 Document Revised: 03/12/2016 Document Reviewed: 10/30/2014 Elsevier Interactive Patient Education  2018 [employer].  Cholesterol Cholesterol is a fat. Your body needs a small amount of cholesterol. Cholesterol (plaque) may build up in your blood vessels (arteries). That makes you more likely to have a heart attack or stroke. You cannot feel your cholesterol level. Having a blood test is the only way to find out if your level is high. Keep your test results. Work with your doctor to keep your cholesterol at a good level. What do the results mean?  Total cholesterol is how much cholesterol is in your blood.  LDL is bad cholesterol. This is the type that can build up. Try to have low LDL.  HDL is  good cholesterol. It cleans your blood vessels and carries LDL away. Try to have high HDL.  Triglycerides are fat that the body can store or burn for  energy. What are good levels of cholesterol?  Total cholesterol below 200.  LDL below 100 is good for people who have health risks. LDL below 70 is good for people who have very high risks.  HDL above 40 is good. It is best to have HDL of 60 or higher.  Triglycerides below 150. How can I lower my cholesterol? Diet Follow your diet program as told by your doctor.  Choose fish, white meat chicken, or [place] that is roasted or baked. Try not to eat red meat, fried foods, sausage, or lunch meats.  Eat lots of fresh fruits and vegetables.  Choose whole grains, beans, pasta, potatoes, and cereals.  Choose olive oil, corn oil, or canola oil. Only use small amounts.  Try not to eat butter, mayonnaise, shortening, or palm kernel oils.  Try not to eat foods with trans fats.  Choose low-fat or nonfat dairy foods. ? Drink skim or nonfat milk. ? Eat low-fat or nonfat yogurt and cheeses. ? Try not to drink whole milk or cream. ? Try not to eat ice cream, egg yolks, or full-fat cheeses.  Healthy desserts include angel food cake, ginger snaps, animal crackers, hard candy, popsicles, and low-fat or nonfat frozen yogurt. Try not to eat pastries, cakes, pies, and cookies.  Exercise Follow your exercise program as told by your doctor.  Be more active. Try gardening, walking, and taking the stairs.  Ask your doctor about ways that you can be more active.  Medicine  Take over-the-counter and prescription medicines only as told by your doctor. This information is not intended to replace advice given to you by your health care provider. Make sure you discuss any questions you have with your health care provider. Document Released: 01/01/2009 Document Revised: 05/06/2016 Document Reviewed: 04/16/2016 Elsevier Interactive Patient Education  2018 [employer].  Cholesterol Cholesterol is a fat. Your body needs a small amount of cholesterol. Cholesterol (plaque) may build up in your blood  vessels (arteries). That makes you more likely to have a heart attack or stroke. You cannot feel your cholesterol level. Having a blood test is the only way to find out if your level is high. Keep your test results. Work with your doctor to keep your cholesterol at a good level. What do the results mean?  Total cholesterol is how much cholesterol is in your blood.  LDL is bad cholesterol. This is the type that can build up. Try to have low LDL.  HDL is good cholesterol. It cleans your blood vessels and carries LDL away. Try to have high HDL.  Triglycerides are fat that the body can store or burn for energy. What are good levels of cholesterol?  Total cholesterol below 200.  LDL below 100 is good for people who have health risks. LDL below 70 is good for people who have very high risks.  HDL above 40 is good. It is best to have HDL of 60 or higher.  Triglycerides below 150. How can I lower my cholesterol? Diet Follow your diet program as told by your doctor.  Choose fish, white meat chicken, or [place] that is roasted or baked. Try not to eat red meat, fried foods, sausage, or lunch meats.  Eat lots of fresh fruits and vegetables.  Choose whole grains, beans, pasta, potatoes, and cereals.  Choose olive oil,  corn oil, or canola oil. Only use small amounts.  Try not to eat butter, mayonnaise, shortening, or palm kernel oils.  Try not to eat foods with trans fats.  Choose low-fat or nonfat dairy foods. ? Drink skim or nonfat milk. ? Eat low-fat or nonfat yogurt and cheeses. ? Try not to drink whole milk or cream. ? Try not to eat ice cream, egg yolks, or full-fat cheeses.  Healthy desserts include angel food cake, ginger snaps, animal crackers, hard candy, popsicles, and low-fat or nonfat frozen yogurt. Try not to eat pastries, cakes, pies, and cookies.  Exercise Follow your exercise program as told by your doctor.  Be more active. Try gardening, walking, and taking the  stairs.  Ask your doctor about ways that you can be more active.  Medicine  Take over-the-counter and prescription medicines only as told by your doctor. This information is not intended to replace advice given to you by your health care provider. Make sure you discuss any questions you have with your health care provider. Document Released: 01/01/2009 Document Revised: 05/06/2016 Document Reviewed: 04/16/2016 Elsevier Interactive Patient Education  2018 [employer].  Cholesterol Cholesterol is a fat. Your body needs a small amount of cholesterol. Cholesterol (plaque) may build up in your blood vessels (arteries). That makes you more likely to have a heart attack or stroke. You cannot feel your cholesterol level. Having a blood test is the only way to find out if your level is high. Keep your test results. Work with your doctor to keep your cholesterol at a good level. What do the results mean?  Total cholesterol is how much cholesterol is in your blood.  LDL is bad cholesterol. This is the type that can build up. Try to have low LDL.  HDL is good cholesterol. It cleans your blood vessels and carries LDL away. Try to have high HDL.  Triglycerides are fat that the body can store or burn for energy. What are good levels of cholesterol?  Total cholesterol below 200.  LDL below 100 is good for people who have health risks. LDL below 70 is good for people who have very high risks.  HDL above 40 is good. It is best to have HDL of 60 or higher.  Triglycerides below 150. How can I lower my cholesterol? Diet Follow your diet program as told by your doctor.  Choose fish, white meat chicken, or [place] that is roasted or baked. Try not to eat red meat, fried foods, sausage, or lunch meats.  Eat lots of fresh fruits and vegetables.  Choose whole grains, beans, pasta, potatoes, and cereals.  Choose olive oil, corn oil, or canola oil. Only use small amounts.  Try not to eat butter,  mayonnaise, shortening, or palm kernel oils.  Try not to eat foods with trans fats.  Choose low-fat or nonfat dairy foods. ? Drink skim or nonfat milk. ? Eat low-fat or nonfat yogurt and cheeses. ? Try not to drink whole milk or cream. ? Try not to eat ice cream, egg yolks, or full-fat cheeses.  Healthy desserts include angel food cake, ginger snaps, animal crackers, hard candy, popsicles, and low-fat or nonfat frozen yogurt. Try not to eat pastries, cakes, pies, and cookies.  Exercise Follow your exercise program as told by your doctor.  Be more active. Try gardening, walking, and taking the stairs.  Ask your doctor about ways that you can be more active.  Medicine  Take over-the-counter and prescription medicines only as told  by your doctor. This information is not intended to replace advice given to you by your health care provider. Make sure you discuss any questions you have with your health care provider. Document Released: 01/01/2009 Document Revised: 05/06/2016 Document Reviewed: 04/16/2016 Elsevier Interactive Patient Education  [employer].

## 2018-06-16 NOTE — Assessment & Plan Note (Signed)
Stable on 150 mg effexor. Continue current regimen

## 2018-06-16 NOTE — Assessment & Plan Note (Signed)
Improving, recommended night braces for further relief. Ibuprofen, epsom salt soaks, stretches, rolling prn

## 2018-06-16 NOTE — Assessment & Plan Note (Signed)
Start zetia. Recheck in 6 months. Continue working on lifestyle modifications

## 2018-06-30 DIAGNOSIS — G4733 Obstructive sleep apnea (adult) (pediatric): Secondary | ICD-10-CM | POA: Diagnosis not present

## 2018-07-01 DIAGNOSIS — G4733 Obstructive sleep apnea (adult) (pediatric): Secondary | ICD-10-CM | POA: Diagnosis not present

## 2018-07-18 ENCOUNTER — Encounter: Payer: Self-pay | Admitting: Family Medicine

## 2018-07-19 ENCOUNTER — Encounter: Payer: Self-pay | Admitting: Family Medicine

## 2018-07-27 ENCOUNTER — Encounter

## 2018-07-27 ENCOUNTER — Encounter: Payer: Self-pay | Admitting: Podiatry

## 2018-07-27 ENCOUNTER — Ambulatory Visit (INDEPENDENT_AMBULATORY_CARE_PROVIDER_SITE_OTHER): Payer: BLUE CROSS/BLUE SHIELD | Admitting: Podiatry

## 2018-07-27 ENCOUNTER — Ambulatory Visit (INDEPENDENT_AMBULATORY_CARE_PROVIDER_SITE_OTHER): Payer: BLUE CROSS/BLUE SHIELD

## 2018-07-27 DIAGNOSIS — M722 Plantar fascial fibromatosis: Secondary | ICD-10-CM

## 2018-07-27 MED ORDER — MELOXICAM 15 MG PO TABS: 15.0000 mg | ORAL_TABLET | Freq: Every day | ORAL | 3 refills | Status: DC

## 2018-07-27 MED ORDER — METHYLPREDNISOLONE 4 MG PO TBPK: ORAL_TABLET | ORAL | 0 refills | Status: DC

## 2018-07-27 NOTE — Patient Instructions (Signed)

## 2018-07-27 NOTE — Progress Notes (Signed)
Subjective:  Patient ID: Marissa Baldwin, female    DOB: [birth year],  MRN: [id number] HPI Chief Complaint  Patient presents with  . Foot Pain    Patietn presents today for right heel pain x 2 months.  She reports her foot has become much worse the past 2 months.  She states 'It feels like im walking on a bruise"  and pain is worse when first getting up in the mornings.  She has been stretching, using frozen bottles of ice water and taking Ibuprofen with slight relief    52 y.o. female presents with the above complaint.   ROS: Denies fever chills nausea vomiting muscle aches pains calf pain back pain chest pain shortness of breath.  Past Medical History:  Diagnosis Date  . BPPV (benign paroxysmal positional vertigo)   . Golfers elbow of left upper extremity   . Hyperlipidemia   . Hypertension   . Macular degeneration   . Morton's neuroma of right foot   . Postmenopausal symptoms   . Sleep apnea    CPAP   Past Surgical History:  Procedure Laterality Date  . COLONOSCOPY WITH PROPOFOL N/A 07/20/2016   Procedure: COLONOSCOPY WITH PROPOFOL;  Surgeon: Lucilla Lame, MD;  Location: [facility];  Service: Endoscopy;  Laterality: N/A;  sleep apnea  . ESOPHAGOGASTRODUODENOSCOPY    . POLYPECTOMY  07/20/2016   Procedure: POLYPECTOMY;  Surgeon: Lucilla Lame, MD;  Location: [facility];  Service: Endoscopy;;    Current Outpatient Medications:  .  acetaminophen (TYLENOL) 500 MG tablet, Take 500 mg by mouth every 6 (six) hours as needed., Disp: , Rfl:  .  AMBULATORY NON FORMULARY MEDICATION, Medication Name: Please dispense cpap mask of choice and new supplies. DX G47.33, Disp: 1 each, Rfl: 0 .  amLODipine (NORVASC) 5 MG tablet, Take 1 tablet (5 mg total) by mouth daily., Disp: 90 tablet, Rfl: 3 .  budesonide (RHINOCORT AQUA) 32 MCG/ACT nasal spray, Place 2 sprays into both nostrils daily., Disp: 1 g, Rfl: 0 .  ezetimibe (ZETIA) 10 MG tablet, Take 1 tablet (10 mg total) by mouth daily.,  Disp: 90 tablet, Rfl: 1 .  losartan (COZAAR) 50 MG tablet, Take 1 tablet (50 mg total) by mouth daily., Disp: 90 tablet, Rfl: 3 .  meloxicam (MOBIC) 15 MG tablet, Take 1 tablet (15 mg total) by mouth daily., Disp: 30 tablet, Rfl: 3 .  methylPREDNISolone (MEDROL DOSEPAK) 4 MG TBPK tablet, 6 day dose pack - take as directed, Disp: 21 tablet, Rfl: 0 .  Multiple Vitamins-Minerals (ICAPS AREDS 2 PO), Take by mouth 2 (two) times daily., Disp: , Rfl:  .  PROAIR HFA 108 (90 Base) MCG/ACT inhaler, Inhale 2 puffs into the lungs every 6 (six) hours as needed for wheezing or shortness of breath., Disp: 1 Inhaler, Rfl: 3 .  Turmeric 500 MG CAPS, Take 2 capsules by mouth daily., Disp: , Rfl:  .  venlafaxine XR (EFFEXOR-XR) 150 MG 24 hr capsule, Take 1 capsule (150 mg total) by mouth daily with breakfast., Disp: 90 capsule, Rfl: 1  Allergies  Allergen Reactions  . Statins Other (See Comments)    Muscle swelling  . Sulfa Antibiotics Swelling    Mouth swelling   Review of Systems Objective:  There were no vitals filed for this visit.  General: Well developed, nourished, in no acute distress, alert and oriented x3   Dermatological: Skin is warm, dry and supple bilateral. Nails x 10 are well maintained; remaining integument appears unremarkable at this  time. There are no open sores, no preulcerative lesions, no rash or signs of infection present.  Vascular: Dorsalis Pedis artery and Posterior Tibial artery pedal pulses are 2/4 bilateral with immedate capillary fill time. Pedal hair growth present. No varicosities and no lower extremity edema present bilateral.   Neruologic: Grossly intact via light touch bilateral. Vibratory intact via tuning fork bilateral. Protective threshold with Semmes Wienstein monofilament intact to all pedal sites bilateral. Patellar and Achilles deep tendon reflexes 2+ bilateral. No Babinski or clonus noted bilateral.   Musculoskeletal: No gross boney pedal deformities bilateral.  No pain, crepitus, or limitation noted with foot and ankle range of motion bilateral. Muscular strength 5/5 in all groups tested bilateral.  Gait: Unassisted, Nonantalgic.    Radiographs:  Radiographs taken today demonstrate an osseously mature individual with a soft tissue increase in density plantar fashion calcaneal insertion site of the right heel.  No acute findings.  Assessment & Plan:   Assessment: Plantar fasciitis right.  Plan: Discussed etiology pathology conservative versus surgical therapies.  At this point injected her right heel with 20 mg Kenalog 5 mg Marcaine after sterile Betadine skin prep.  Tolerated procedure well.  Placed her in a plantar fascial brace to be followed by a night splint at bedtime.  We discussed appropriate shoe gear stretching exercise ice therapy sugar modifications.  We also instructed discussed stretching exercises which will be printed for her on her paperwork.  I will follow-up with her in 1 month at which time she will bring her over-the-counter orthotics.     [place] Reliford [address] [place], [place]

## 2018-08-04 DIAGNOSIS — Z23 Encounter for immunization: Secondary | ICD-10-CM | POA: Diagnosis not present

## 2018-08-05 ENCOUNTER — Other Ambulatory Visit: Payer: Self-pay | Admitting: Family Medicine

## 2018-08-15 ENCOUNTER — Telehealth: Payer: Self-pay | Admitting: Podiatry

## 2018-08-15 NOTE — Telephone Encounter (Signed)
Spoke with patient, she wanted to know if she could wear her night splint to work.  I informed her that the night splint is only for when she sleeps and she should continue to wear her plantar fasciitis brace, use ice and soak in Epson salt.  I informed patient that if she continues to have a lot of pain in her foot, she can call office back to see if we can get her in sooner.  She verbalized understanding

## 2018-08-15 NOTE — Telephone Encounter (Signed)
I am calling to speak to the nurse. I will be leaving the house about 12:30 to go to work. Thank you.

## 2018-08-19 DIAGNOSIS — M1990 Unspecified osteoarthritis, unspecified site: Secondary | ICD-10-CM

## 2018-08-19 HISTORY — DX: Unspecified osteoarthritis, unspecified site: M19.90

## 2018-08-29 ENCOUNTER — Encounter: Payer: Self-pay | Admitting: Podiatry

## 2018-08-29 ENCOUNTER — Ambulatory Visit (INDEPENDENT_AMBULATORY_CARE_PROVIDER_SITE_OTHER): Payer: BLUE CROSS/BLUE SHIELD | Admitting: Podiatry

## 2018-08-29 DIAGNOSIS — M722 Plantar fascial fibromatosis: Secondary | ICD-10-CM | POA: Diagnosis not present

## 2018-08-29 MED ORDER — DICLOFENAC SODIUM 1 % TD GEL: 4.0000 g | Freq: Four times a day (QID) | TRANSDERMAL | 2 refills | Status: DC

## 2018-08-30 NOTE — Progress Notes (Signed)
She presents today for follow-up of her plantar fasciitis she says some days are better than others in the brace gave me is not helping.  Objective: Vital signs are stable alert and oriented x3.  Pulses are palpable.  Neurologic sensorium is intact.  Degenerative flexors are intact.  Muscle strength is normal symmetrical.  She has pain on palpation medial calcaneal tubercle of the right heel.  To me it feels better than it did previously and she does not seem to have the same amount of pain that she had previously.  Assessment: Plantar fasciitis.  Plan: I encouraged her to continue her to continue all conservative therapies I reinjected the right heel today with 20 mg Kenalog 5 mg Marcaine point maximal tenderness.  I will follow-up with her in 1 month

## 2018-09-01 ENCOUNTER — Encounter: Payer: Self-pay | Admitting: Podiatry

## 2018-09-01 ENCOUNTER — Telehealth: Payer: Self-pay | Admitting: Podiatry

## 2018-09-01 NOTE — Telephone Encounter (Signed)
Patient was seen on Monday 11/11 and Dr Milinda Pointer prescribed her an inflamation medication and the pharmacy is needing prior authorization. Pharmacy states they faxed over the paperwork to be filled out. Please give patient a call.

## 2018-09-06 NOTE — Telephone Encounter (Signed)
Patient has been notified that per [facility], medication has been denied.  She stated that she has been using OTC lidocaine with some relief

## 2018-10-10 ENCOUNTER — Ambulatory Visit: Payer: BLUE CROSS/BLUE SHIELD | Admitting: Podiatry

## 2018-10-16 DIAGNOSIS — H66001 Acute suppurative otitis media without spontaneous rupture of ear drum, right ear: Secondary | ICD-10-CM | POA: Diagnosis not present

## 2018-10-19 ENCOUNTER — Encounter: Payer: Self-pay | Admitting: Family Medicine

## 2018-10-21 ENCOUNTER — Ambulatory Visit: Payer: Self-pay

## 2018-10-21 NOTE — Telephone Encounter (Signed)
Pt c/o chest pressure that started today. Pt describes the pain as constant and mild. Pt has many risk factors for heart disease. Pt c/o chest congestion. Pt stated that it hurts her to take a deep breath in. Pt stated that pain is between her breasts and shoot to her back. Pt advised to go to ED for evaluation. Pt stated she will be going to [facility]. Care advice given and pt verbalized understanding.   Reason for Disposition . Taking a deep breath makes pain worse  Answer Assessment - Initial Assessment Questions 1. LOCATION: "Where does it hurt?"       Between breast then shoots to back when she takes a breath- chest pressure 2. RADIATION: "Does the pain go anywhere else?" (e.g., into neck, jaw, arms, back)     back 3. ONSET: "When did the chest pain begin?" (Minutes, hours or days)      today 4. PATTERN "Does the pain come and go, or has it been constant since it started?"  "Does it get worse with exertion?"      constant 5. DURATION: "How long does it last" (e.g., seconds, minutes, hours)     constant 6. SEVERITY: "How bad is the pain?"  (e.g., Scale 1-10; mild, moderate, or severe)    - MILD (1-3): doesn't interfere with normal activities     - MODERATE (4-7): interferes with normal activities or awakens from sleep    - SEVERE (8-10): excruciating pain, unable to do any normal activities       mild 7. CARDIAC RISK FACTORS: "Do you have any history of heart problems or risk factors for heart disease?" (e.g., prior heart attack, angina; high blood pressure, diabetes, being overweight, high cholesterol, smoking, or strong family history of heart disease)     HTN, overweight, high cholesterol, strong family h/o heart disease 8. PULMONARY RISK FACTORS: "Do you have any history of lung disease?"  (e.g., blood clots in lung, asthma, emphysema, birth control pills)     no 9. CAUSE: "What do you think is causing the chest pain?"     Chest congestion 10. OTHER SYMPTOMS: "Do you have any other  symptoms?" (e.g., dizziness, nausea, vomiting, sweating, fever, difficulty breathing, cough)       Sweating (per pt is normal for her), occasional cough 11. PREGNANCY: "Is there any chance you are pregnant?" "When was your last menstrual period?"       n/a  Protocols used: CHEST PAIN-A-AH

## 2018-10-27 ENCOUNTER — Encounter: Payer: Self-pay | Admitting: Family Medicine

## 2018-10-28 ENCOUNTER — Other Ambulatory Visit: Payer: Self-pay | Admitting: Family Medicine

## 2018-10-28 MED ORDER — FLUCONAZOLE 150 MG PO TABS: 150.0000 mg | ORAL_TABLET | Freq: Once | ORAL | 0 refills | Status: AC

## 2018-11-07 ENCOUNTER — Other Ambulatory Visit: Payer: Self-pay | Admitting: Unknown Physician Specialty

## 2018-11-07 NOTE — Telephone Encounter (Signed)
Requested Prescriptions  Pending Prescriptions Disp Refills  . amLODipine (NORVASC) 5 MG tablet [Pharmacy Med Name: AMLODIPINE BESYLATE 5 MG TAB] 90 tablet 0    Sig: Take 1 tablet (5 mg total) by mouth daily.     Cardiovascular:  Calcium Channel Blockers Passed - 11/07/2018 12:45 PM      Passed - Last BP in normal range    BP Readings from Last 1 Encounters:  06/14/18 129/89         Passed - Valid encounter within last 6 months    Recent Outpatient Visits          4 months ago Plantar fasciitis   [facility] Merrie Roof [place], [place]   5 months ago Elevated alkaline phosphatase level   [place], Malachy Mood, NP   12 months ago Annual physical exam   [facility] Kathrine Haddock, NP   1 year ago Class 1 obesity due to excess calories without serious comorbidity with body mass index (BMI) of 31.0 to 31.9 in adult   [place], NP   2 years ago Acute maxillary sinusitis, recurrence not specified   [place], Satira Anis, MD      Future Appointments            In 1 week Volney American, PA-C [facility], [facility]         . losartan (COZAAR) 50 MG tablet [Pharmacy Med Name: LOSARTAN POTASSIUM 50 MG TAB] 90 tablet 0    Sig: Take 1 tablet (50 mg total) by mouth daily.     Cardiovascular:  Angiotensin Receptor Blockers Failed - 11/07/2018 12:45 PM      Failed - Cr in normal range and within 180 days    Creat  Date Value Ref Range Status  05/14/2016 0.62 0.50 - 1.05 mg/dL Final    Comment:      For patients > or = 53 years of age: The upper reference limit for Creatinine is approximately 13% higher for people identified as African-American.      Creatinine, Ser  Date Value Ref Range Status  05/13/2018 0.56 (L) 0.57 - 1.00 mg/dL Final         Passed - K in normal range and within 180 days    Potassium  Date Value Ref Range Status  05/13/2018 4.3 3.5 - 5.2 mmol/L  Final         Passed - Patient is not pregnant      Passed - Last BP in normal range    BP Readings from Last 1 Encounters:  06/14/18 129/89         Passed - Valid encounter within last 6 months    Recent Outpatient Visits          4 months ago Plantar fasciitis   [facility] Volney American, [place]   5 months ago Elevated alkaline phosphatase level   [place], NP   12 months ago Annual physical exam   [facility] Kathrine Haddock, NP   1 year ago Class 1 obesity due to excess calories without serious comorbidity with body mass index (BMI) of 31.0 to 31.9 in adult   [place], NP   2 years ago Acute maxillary sinusitis, recurrence not specified   [place], Satira Anis, MD      Future Appointments  In 1 week Volney American, PA-C [facility], [facility]

## 2018-11-14 ENCOUNTER — Encounter: Payer: Self-pay | Admitting: Family Medicine

## 2018-11-14 ENCOUNTER — Ambulatory Visit (INDEPENDENT_AMBULATORY_CARE_PROVIDER_SITE_OTHER): Payer: BLUE CROSS/BLUE SHIELD | Admitting: Family Medicine

## 2018-11-14 VITALS — BP 136/84 | HR 77 | Temp 98.6°F | Ht 59.4 in | Wt 171.4 lb

## 2018-11-14 DIAGNOSIS — R7301 Impaired fasting glucose: Secondary | ICD-10-CM

## 2018-11-14 DIAGNOSIS — F324 Major depressive disorder, single episode, in partial remission: Secondary | ICD-10-CM | POA: Diagnosis not present

## 2018-11-14 DIAGNOSIS — I10 Essential (primary) hypertension: Secondary | ICD-10-CM

## 2018-11-14 DIAGNOSIS — J4520 Mild intermittent asthma, uncomplicated: Secondary | ICD-10-CM | POA: Diagnosis not present

## 2018-11-14 DIAGNOSIS — Z0000 Encounter for general adult medical examination without abnormal findings: Secondary | ICD-10-CM | POA: Diagnosis not present

## 2018-11-14 DIAGNOSIS — E782 Mixed hyperlipidemia: Secondary | ICD-10-CM

## 2018-11-14 DIAGNOSIS — F5101 Primary insomnia: Secondary | ICD-10-CM

## 2018-11-14 MED ORDER — EZETIMIBE 10 MG PO TABS: 10.0000 mg | ORAL_TABLET | Freq: Every day | ORAL | 1 refills | Status: DC

## 2018-11-14 MED ORDER — PROAIR HFA 108 (90 BASE) MCG/ACT IN AERS: 2.0000 | INHALATION_SPRAY | Freq: Four times a day (QID) | RESPIRATORY_TRACT | 3 refills | Status: DC | PRN

## 2018-11-14 MED ORDER — AMLODIPINE BESYLATE 5 MG PO TABS: 5.0000 mg | ORAL_TABLET | Freq: Every day | ORAL | 1 refills | Status: DC

## 2018-11-14 MED ORDER — VENLAFAXINE HCL ER 150 MG PO CP24: 150.0000 mg | ORAL_CAPSULE | Freq: Every day | ORAL | 1 refills | Status: DC

## 2018-11-14 MED ORDER — LOSARTAN POTASSIUM 50 MG PO TABS: 50.0000 mg | ORAL_TABLET | Freq: Every day | ORAL | 1 refills | Status: DC

## 2018-11-14 MED ORDER — QUETIAPINE FUMARATE 25 MG PO TABS: 25.0000 mg | ORAL_TABLET | Freq: Every day | ORAL | 1 refills | Status: DC

## 2018-11-14 NOTE — Progress Notes (Signed)
BP 136/84 (BP Location: Left Arm, Patient Position: Sitting, Cuff Size: Large)   Pulse 77   Temp 98.6 F (37 C)   Ht 4' 11.4" (1.509 m)   Wt 171 lb 7 oz (77.8 kg)   SpO2 96%   BMI 34.16 kg/m    Subjective:    Patient ID: Marissa Baldwin, female    DOB: 1966-08-25, 53 y.o.   MRN: [id number]  HPI: Marissa Baldwin is a 53 y.o. female presenting on 11/14/2018 for comprehensive medical examination. Current medical complaints include:see below  Under lots of stress right now, feels her moods/anxiety are not well controlled and that she's not sleeping well. Still taking her effexor which she still feels helps. Denies SI/HI.   HTN - stable on current regimen, does not regularly check home BPs. No side effects, CP, SOB, dizziness, HAs.   IFG - Diet controlled. Has not been watching diet as carefully lately due to stress.   Asthma - stable on proair, no recent exacerbations  HLD - Taking zetia without issue. Intolerant to statins.   She currently lives with: Menopausal Symptoms: no  Depression Screen done today and results listed below:  Depression screen [facility] 2/9 11/14/2018 11/14/2018 06/14/2018 05/13/2018 11/12/2017  Decreased Interest 0 0 1 1 0  Down, Depressed, Hopeless 1 1 2 1  0  PHQ - 2 Score 1 1 3 2  0  Altered sleeping 3 - 1 2 -  Tired, decreased energy 3 - 3 2 -  Change in appetite 0 - 3 3 -  Feeling bad or failure about yourself  0 - 2 0 -  Trouble concentrating 0 - 3 0 -  Moving slowly or fidgety/restless 0 - 2 2 -  Suicidal thoughts 0 - 0 0 -  PHQ-9 Score 7 - 17 11 -  Difficult doing work/chores Not difficult at all - - - -    The patient does not have a history of falls. I did not complete a risk assessment for falls. A plan of care for falls was not documented.   Past Medical History:  Past Medical History:  Diagnosis Date  . Anxiety   . Arthritis 11/19   Hands hip shoulders  . BPPV (benign paroxysmal positional vertigo)   . Depression 04/2018  . Golfers elbow of left  upper extremity   . Hyperlipidemia   . Hypertension   . Macular degeneration   . Morton's neuroma of right foot   . Postmenopausal symptoms   . Sleep apnea    CPAP    Surgical History:  Past Surgical History:  Procedure Laterality Date  . COLONOSCOPY WITH PROPOFOL N/A 07/20/2016   Procedure: COLONOSCOPY WITH PROPOFOL;  Surgeon: Lucilla Lame, MD;  Location: [place];  Service: Endoscopy;  Laterality: N/A;  sleep apnea  . ESOPHAGOGASTRODUODENOSCOPY    . POLYPECTOMY  07/20/2016   Procedure: POLYPECTOMY;  Surgeon: Lucilla Lame, MD;  Location: [place];  Service: Endoscopy;;    Medications:  Current Outpatient Medications on File Prior to Visit  Medication Sig  . acetaminophen (TYLENOL) 500 MG tablet Take 500 mg by mouth every 6 (six) hours as needed.  . AMBULATORY NON FORMULARY MEDICATION Medication Name: Please dispense cpap mask of choice and new supplies. DX G47.33  . budesonide (RHINOCORT AQUA) 32 MCG/ACT nasal spray Place 2 sprays into both nostrils daily.  . diclofenac sodium (VOLTAREN) 1 % GEL Apply 4 g topically 4 (four) times daily.  . Multiple Vitamins-Minerals (ICAPS AREDS 2 PO)  Take by mouth 2 (two) times daily.   No current facility-administered medications on file prior to visit.     Allergies:  Allergies  Allergen Reactions  . Meloxicam Other (See Comments)    GI upset and pain  . Statins Other (See Comments)    Muscle swelling  . Sulfa Antibiotics Swelling    Mouth swelling    Social History:  Social History   Socioeconomic History  . Marital status: Married    Spouse name: Not on file  . Number of children: Not on file  . Years of education: Not on file  . Highest education level: Not on file  Occupational History  . Not on file  Social Needs  . Financial resource strain: Not on file  . Food insecurity:    Worry: Not on file    Inability: Not on file  . Transportation needs:    Medical: Not on file    Non-medical: Not on file   Tobacco Use  . Smoking status: Never Smoker  . Smokeless tobacco: Never Used  Substance and Sexual Activity  . Alcohol use: Yes    Comment: occasional  . Drug use: No  . Sexual activity: Yes    Birth control/protection: None  Lifestyle  . Physical activity:    Days per week: Not on file    Minutes per session: Not on file  . Stress: Not on file  Relationships  . Social connections:    Talks on phone: Not on file    Gets together: Not on file    Attends religious service: Not on file    Active member of club or organization: Not on file    Attends meetings of clubs or organizations: Not on file    Relationship status: Not on file  . Intimate partner violence:    Fear of current or ex partner: Not on file    Emotionally abused: Not on file    Physically abused: Not on file    Forced sexual activity: Not on file  Other Topics Concern  . Not on file  Social History Narrative  . Not on file   Social History   Tobacco Use  Smoking Status Never Smoker  Smokeless Tobacco Never Used   Social History   Substance and Sexual Activity  Alcohol Use Yes   Comment: occasional    Family History:  Family History  Problem Relation Age of Onset  . Diabetes Father   . Hypertension Father   . Heart disease Father   . Stroke Father        mini strokes  . Neuropathy Father   . Depression Father   . Cancer Maternal Aunt        rectal  . Varicose Veins Maternal Grandmother   . Diabetes Maternal Grandfather   . Heart disease Paternal Grandfather   . COPD Neg Hx     Past medical history, surgical history, medications, allergies, family history and social history reviewed with patient today and changes made to appropriate areas of the chart.   Review of Systems - General ROS: positive for  - fatigue and sleep disturbance Psychological ROS: positive for - anxiety and depression Ophthalmic ROS: negative ENT ROS: negative Allergy and Immunology ROS: negative Hematological and  Lymphatic ROS: negative Endocrine ROS: negative Breast ROS: negative for breast lumps Respiratory ROS: no cough, shortness of breath, or wheezing Cardiovascular ROS: no chest pain or dyspnea on exertion Gastrointestinal ROS: no abdominal pain, change in bowel habits,  or black or bloody stools Genito-Urinary ROS: no dysuria, trouble voiding, or hematuria Musculoskeletal ROS: negative Neurological ROS: no TIA or stroke symptoms Dermatological ROS: negative All other ROS negative except what is listed above and in the HPI.      Objective:    BP 136/84 (BP Location: Left Arm, Patient Position: Sitting, Cuff Size: Large)   Pulse 77   Temp 98.6 F (37 C)   Ht 4' 11.4" (1.509 m)   Wt 171 lb 7 oz (77.8 kg)   SpO2 96%   BMI 34.16 kg/m   Wt Readings from Last 3 Encounters:  11/14/18 171 lb 7 oz (77.8 kg)  06/14/18 173 lb 8 oz (78.7 kg)  05/13/18 173 lb 12.8 oz (78.8 kg)    Physical Exam Vitals signs and nursing note reviewed.  Constitutional:      General: She is not in acute distress.    Appearance: She is well-developed.  HENT:     Head: Atraumatic.     Right Ear: External ear normal.     Left Ear: External ear normal.     Nose: Nose normal.     Mouth/Throat:     Pharynx: No oropharyngeal exudate.  Eyes:     General: No scleral icterus.    Conjunctiva/sclera: Conjunctivae normal.     Pupils: Pupils are equal, round, and reactive to light.  Neck:     Musculoskeletal: Normal range of motion and neck supple.     Thyroid: No thyromegaly.  Cardiovascular:     Rate and Rhythm: Normal rate and regular rhythm.     Heart sounds: Normal heart sounds.  Pulmonary:     Effort: Pulmonary effort is normal. No respiratory distress.     Breath sounds: Normal breath sounds.  Chest:     Breasts:        Right: No mass, skin change or tenderness.        Left: No mass, skin change or tenderness.  Abdominal:     General: Bowel sounds are normal.     Palpations: Abdomen is soft. There is  no mass.     Tenderness: There is no abdominal tenderness.  Musculoskeletal: Normal range of motion.        General: No tenderness.  Lymphadenopathy:     Cervical: No cervical adenopathy.     Upper Body:     Right upper body: No axillary adenopathy.     Left upper body: No axillary adenopathy.  Skin:    General: Skin is warm and dry.     Findings: No rash.  Neurological:     Mental Status: She is alert and oriented to person, place, and time.     Cranial Nerves: No cranial nerve deficit.  Psychiatric:        Behavior: Behavior normal.     Results for orders placed or performed in visit on 11/14/18  Microscopic Examination  Result Value Ref Range   WBC, UA 0-5 0 - 5 /hpf   RBC, UA None seen 0 - 2 /hpf   Epithelial Cells (non renal) 0-10 0 - 10 /hpf   Renal Epithel, UA 0-10 (A) None seen /hpf   Mucus, UA Present Not Estab.   Bacteria, UA Moderate (A) None seen/Few  Urine Culture, Reflex  Result Value Ref Range   Urine Culture, Routine Final report    Organism ID, Bacteria Comment   CBC with Differential/Platelet  Result Value Ref Range   WBC 5.8 3.4 - 10.8 x10E3/uL  RBC 4.86 3.77 - 5.28 x10E6/uL   Hemoglobin 14.6 11.1 - 15.9 g/dL   Hematocrit 43.1 34.0 - 46.6 %   MCV 89 79 - 97 fL   MCH 30.0 26.6 - 33.0 pg   MCHC 33.9 31.5 - 35.7 g/dL   RDW 12.9 11.7 - 15.4 %   Platelets 310 150 - 450 x10E3/uL   Neutrophils 56 Not Estab. %   Lymphs 37 Not Estab. %   Monocytes 5 Not Estab. %   Eos 1 Not Estab. %   Basos 1 Not Estab. %   Neutrophils Absolute 3.2 1.4 - 7.0 x10E3/uL   Lymphocytes Absolute 2.1 0.7 - 3.1 x10E3/uL   Monocytes Absolute 0.3 0.1 - 0.9 x10E3/uL   EOS (ABSOLUTE) 0.1 0.0 - 0.4 x10E3/uL   Basophils Absolute 0.1 0.0 - 0.2 x10E3/uL   Immature Granulocytes 0 Not Estab. %   Immature Grans (Abs) 0.0 0.0 - 0.1 x10E3/uL  Comprehensive metabolic panel  Result Value Ref Range   Glucose 177 (H) 65 - 99 mg/dL   BUN 13 6 - 24 mg/dL   Creatinine, Ser 0.54 (L) 0.57 -  1.00 mg/dL   GFR calc non Af Amer 109 >59 mL/min/1.73   GFR calc Af Amer 125 >59 mL/min/1.73   BUN/Creatinine Ratio 24 (H) 9 - 23   Sodium 139 134 - 144 mmol/L   Potassium 4.4 3.5 - 5.2 mmol/L   Chloride 100 96 - 106 mmol/L   CO2 21 20 - 29 mmol/L   Calcium 9.5 8.7 - 10.2 mg/dL   Total Protein 7.6 6.0 - 8.5 g/dL   Albumin 4.8 3.8 - 4.9 g/dL   Globulin, Total 2.8 1.5 - 4.5 g/dL   Albumin/Globulin Ratio 1.7 1.2 - 2.2   Bilirubin Total 0.6 0.0 - 1.2 mg/dL   Alkaline Phosphatase 127 (H) 39 - 117 IU/L   AST 52 (H) 0 - 40 IU/L   ALT 73 (H) 0 - 32 IU/L  Lipid Panel w/o Chol/HDL Ratio  Result Value Ref Range   Cholesterol, Total 213 (H) 100 - 199 mg/dL   Triglycerides 93 0 - 149 mg/dL   HDL 63 >39 mg/dL   VLDL Cholesterol Cal 19 5 - 40 mg/dL   LDL Calculated 131 (H) 0 - 99 mg/dL  TSH  Result Value Ref Range   TSH 0.640 0.450 - 4.500 uIU/mL  HgB A1c  Result Value Ref Range   Hgb A1c MFr Bld 7.3 (H) 4.8 - 5.6 %   Est. average glucose Bld gHb Est-mCnc 163 mg/dL  UA/M w/rflx Culture, Routine  Result Value Ref Range   Specific Gravity, UA 1.025 1.005 - 1.030   pH, UA 6.5 5.0 - 7.5   Color, UA Yellow Yellow   Appearance Ur Clear Clear   Leukocytes, UA 1+ (A) Negative   Protein, UA Trace (A) Negative/Trace   Glucose, UA 2+ (A) Negative   Ketones, UA Trace (A) Negative   RBC, UA Negative Negative   Bilirubin, UA Negative Negative   Urobilinogen, Ur 0.2 0.2 - 1.0 mg/dL   Nitrite, UA Negative Negative   Microscopic Examination See below:    Urinalysis Reflex Comment       Assessment & Plan:   Problem List Items Addressed This Visit      Cardiovascular and Mediastinum   Essential hypertension, benign - Primary    Stable and WNL, continue current regimen      Relevant Medications   losartan (COZAAR) 50 MG tablet   ezetimibe (  ZETIA) 10 MG tablet   amLODipine (NORVASC) 5 MG tablet   Other Relevant Orders   CBC with Differential/Platelet (Completed)   Comprehensive metabolic  panel (Completed)   UA/M w/rflx Culture, Routine (Completed)     Respiratory   Asthma    Stable, continue current regimen      Relevant Medications   PROAIR HFA 108 (90 Base) MCG/ACT inhaler     Endocrine   IFG (impaired fasting glucose)    Recheck A1C, adjust as needed      Relevant Orders   HgB A1c (Completed)     Other   Hyperlipidemia    Recheck lipids, continue zetia and lifestyle modifications      Relevant Medications   losartan (COZAAR) 50 MG tablet   ezetimibe (ZETIA) 10 MG tablet   amLODipine (NORVASC) 5 MG tablet   Insomnia    Add seroquel, continue good sleep hygiene      Depression, major, single episode, in partial remission (HCC)    Worsened, and now not sleeping. Will add seroquel to effexor and monitor closely for benefit. Counseling recommended, she will consider      Relevant Medications   venlafaxine XR (EFFEXOR-XR) 150 MG 24 hr capsule    Other Visit Diagnoses    Annual physical exam       Relevant Orders   Lipid Panel w/o Chol/HDL Ratio (Completed)   TSH (Completed)       Follow up plan: Return in about 6 months (around 05/15/2019) for BP, chol, depression f/u.   LABORATORY TESTING:  - Pap smear: up to date  IMMUNIZATIONS:   - Tdap: Tetanus vaccination status reviewed: last tetanus booster within 10 years. - Influenza: Up to date  SCREENING: -Mammogram: Up to date  - Colonoscopy: Up to date   PATIENT COUNSELING:   Advised to take 1 mg of folate supplement per day if capable of pregnancy.   Sexuality: Discussed sexually transmitted diseases, partner selection, use of condoms, avoidance of unintended pregnancy  and contraceptive alternatives.   Advised to avoid cigarette smoking.  I discussed with the patient that most people either abstain from alcohol or drink within safe limits (<=14/week and <=4 drinks/occasion for males, <=7/weeks and <= 3 drinks/occasion for females) and that the risk for alcohol disorders and other health  effects rises proportionally with the number of drinks per week and how often a drinker exceeds daily limits.  Discussed cessation/primary prevention of drug use and availability of treatment for abuse.   Diet: Encouraged to adjust caloric intake to maintain  or achieve ideal body weight, to reduce intake of dietary saturated fat and total fat, to limit sodium intake by avoiding high sodium foods and not adding table salt, and to maintain adequate dietary potassium and calcium preferably from fresh fruits, vegetables, and low-fat dairy products.    stressed the importance of regular exercise  Injury prevention: Discussed safety belts, safety helmets, smoke detector, smoking near bedding or upholstery.   Dental health: Discussed importance of regular tooth brushing, flossing, and dental visits.    NEXT PREVENTATIVE PHYSICAL DUE IN 1 YEAR. Return in about 6 months (around 05/15/2019) for BP, chol, depression f/u.

## 2018-11-15 LAB — CBC WITH DIFFERENTIAL/PLATELET
Basophils Absolute: 0.1 10*3/uL (ref 0.0–0.2)
Basos: 1 %
EOS (ABSOLUTE): 0.1 10*3/uL (ref 0.0–0.4)
Eos: 1 %
Hematocrit: 43.1 % (ref 34.0–46.6)
Hemoglobin: 14.6 g/dL (ref 11.1–15.9)
IMMATURE GRANS (ABS): 0 10*3/uL (ref 0.0–0.1)
Immature Granulocytes: 0 %
LYMPHS: 37 %
Lymphocytes Absolute: 2.1 10*3/uL (ref 0.7–3.1)
MCH: 30 pg (ref 26.6–33.0)
MCHC: 33.9 g/dL (ref 31.5–35.7)
MCV: 89 fL (ref 79–97)
Monocytes Absolute: 0.3 10*3/uL (ref 0.1–0.9)
Monocytes: 5 %
Neutrophils Absolute: 3.2 10*3/uL (ref 1.4–7.0)
Neutrophils: 56 %
Platelets: 310 10*3/uL (ref 150–450)
RBC: 4.86 x10E6/uL (ref 3.77–5.28)
RDW: 12.9 % (ref 11.7–15.4)
WBC: 5.8 10*3/uL (ref 3.4–10.8)

## 2018-11-15 LAB — COMPREHENSIVE METABOLIC PANEL
ALT: 73 IU/L — ABNORMAL HIGH (ref 0–32)
AST: 52 IU/L — ABNORMAL HIGH (ref 0–40)
Albumin/Globulin Ratio: 1.7 (ref 1.2–2.2)
Albumin: 4.8 g/dL (ref 3.8–4.9)
Alkaline Phosphatase: 127 IU/L — ABNORMAL HIGH (ref 39–117)
BUN/Creatinine Ratio: 24 — ABNORMAL HIGH (ref 9–23)
BUN: 13 mg/dL (ref 6–24)
Bilirubin Total: 0.6 mg/dL (ref 0.0–1.2)
CHLORIDE: 100 mmol/L (ref 96–106)
CO2: 21 mmol/L (ref 20–29)
Calcium: 9.5 mg/dL (ref 8.7–10.2)
Creatinine, Ser: 0.54 mg/dL — ABNORMAL LOW (ref 0.57–1.00)
GFR calc non Af Amer: 109 mL/min/{1.73_m2} (ref >59)
GFR, EST AFRICAN AMERICAN: 125 mL/min/{1.73_m2} (ref >59)
Globulin, Total: 2.8 g/dL (ref 1.5–4.5)
Glucose: 177 mg/dL — ABNORMAL HIGH (ref 65–99)
Potassium: 4.4 mmol/L (ref 3.5–5.2)
Sodium: 139 mmol/L (ref 134–144)
Total Protein: 7.6 g/dL (ref 6.0–8.5)

## 2018-11-15 LAB — LIPID PANEL W/O CHOL/HDL RATIO
Cholesterol, Total: 213 mg/dL — ABNORMAL HIGH (ref 100–199)
HDL: 63 mg/dL (ref >39)
LDL Calculated: 131 mg/dL — ABNORMAL HIGH (ref 0–99)
Triglycerides: 93 mg/dL (ref 0–149)
VLDL Cholesterol Cal: 19 mg/dL (ref 5–40)

## 2018-11-15 LAB — TSH: TSH: 0.64 u[IU]/mL (ref 0.450–4.500)

## 2018-11-15 LAB — HEMOGLOBIN A1C
Est. average glucose Bld gHb Est-mCnc: 163 mg/dL
Hgb A1c MFr Bld: 7.3 % — ABNORMAL HIGH (ref 4.8–5.6)

## 2018-11-16 ENCOUNTER — Other Ambulatory Visit: Payer: Self-pay | Admitting: Family Medicine

## 2018-11-16 LAB — UA/M W/RFLX CULTURE, ROUTINE
BILIRUBIN UA: NEGATIVE
Nitrite, UA: NEGATIVE
RBC, UA: NEGATIVE
Specific Gravity, UA: 1.025 (ref 1.005–1.030)
Urobilinogen, Ur: 0.2 mg/dL (ref 0.2–1.0)
pH, UA: 6.5 (ref 5.0–7.5)

## 2018-11-16 LAB — MICROSCOPIC EXAMINATION: RBC, UA: NONE SEEN /hpf (ref 0–2)

## 2018-11-16 LAB — URINE CULTURE, REFLEX

## 2018-11-16 MED ORDER — METFORMIN HCL 500 MG PO TABS: 500.0000 mg | ORAL_TABLET | Freq: Two times a day (BID) | ORAL | 2 refills | Status: DC

## 2018-11-17 NOTE — Assessment & Plan Note (Signed)
Recheck A1C, adjust as needed

## 2018-11-17 NOTE — Assessment & Plan Note (Signed)
Worsened, and now not sleeping. Will add seroquel to effexor and monitor closely for benefit. Counseling recommended, she will consider

## 2018-11-17 NOTE — Assessment & Plan Note (Signed)
Add seroquel, continue good sleep hygiene

## 2018-11-17 NOTE — Assessment & Plan Note (Signed)
Stable, continue current regimen 

## 2018-11-17 NOTE — Assessment & Plan Note (Signed)
Recheck lipids, continue zetia and lifestyle modifications 

## 2018-11-17 NOTE — Assessment & Plan Note (Signed)
Stable and WNL, continue current regimen 

## 2018-11-23 ENCOUNTER — Encounter: Payer: Self-pay | Admitting: Family Medicine

## 2018-12-27 DIAGNOSIS — H524 Presbyopia: Secondary | ICD-10-CM | POA: Diagnosis not present

## 2018-12-27 LAB — HM DIABETES EYE EXAM

## 2019-01-04 ENCOUNTER — Encounter: Payer: Self-pay | Admitting: Family Medicine

## 2019-01-12 ENCOUNTER — Other Ambulatory Visit: Payer: Self-pay | Admitting: Family Medicine

## 2019-02-27 ENCOUNTER — Other Ambulatory Visit: Payer: Self-pay | Admitting: Family Medicine

## 2019-02-27 NOTE — Telephone Encounter (Signed)
Requested medication (s) are due for refill today -yes  Requested medication (s) are on the active medication list -yes  Future visit scheduled -yes  Last refill: 11/14/18  Notes to clinic: Patient is requesting refill of non delegated Rx- sent for PCP review if request.  Requested Prescriptions  Pending Prescriptions Disp Refills   QUEtiapine (SEROQUEL) 25 MG tablet [Pharmacy Med Name: QUETIAPINE FUMARATE 25 MG TAB] 30 tablet 0    Sig: Take 1 tablet (25 mg total) by mouth at bedtime.     Not Delegated - Psychiatry:  Antipsychotics - Second Generation (Atypical) - quetiapine Failed - 02/27/2019 12:05 PM      Failed - This refill cannot be delegated      Failed - ALT in normal range and within 180 days    ALT  Date Value Ref Range Status  11/14/2018 73 (H) 0 - 32 IU/L Final   ALT (SGPT) Piccolo, Waived  Date Value Ref Range Status  12/03/2015 40 10 - 47 U/L Final         Failed - AST in normal range and within 180 days    AST  Date Value Ref Range Status  11/14/2018 52 (H) 0 - 40 IU/L Final   AST (SGOT) Piccolo, Waived  Date Value Ref Range Status  12/03/2015 35 11 - 38 U/L Final         Passed - Completed PHQ-2 or PHQ-9 in the last 360 days.      Passed - Last BP in normal range    BP Readings from Last 1 Encounters:  11/14/18 136/84         Passed - Valid encounter within last 6 months    Recent Outpatient Visits          3 months ago Essential hypertension, benign   [facility] Merrie Roof [place], [place]   8 months ago Plantar fasciitis   [place], [place], [place]   9 months ago Elevated alkaline phosphatase level   [place], NP   1 year ago Annual physical exam   [facility] Kathrine Haddock, NP   1 year ago Class 1 obesity due to excess calories without serious comorbidity with body mass index (BMI) of 31.0 to 31.9 in adult   [place], NP       Future Appointments            In 2 months Orene Desanctis, Lilia Argue, PA-C [place], [facility]            Requested Prescriptions  Pending Prescriptions Disp Refills   QUEtiapine (SEROQUEL) 25 MG tablet [Pharmacy Med Name: QUETIAPINE FUMARATE 25 MG TAB] 30 tablet 0    Sig: Take 1 tablet (25 mg total) by mouth at bedtime.     Not Delegated - Psychiatry:  Antipsychotics - Second Generation (Atypical) - quetiapine Failed - 02/27/2019 12:05 PM      Failed - This refill cannot be delegated      Failed - ALT in normal range and within 180 days    ALT  Date Value Ref Range Status  11/14/2018 73 (H) 0 - 32 IU/L Final   ALT (SGPT) Piccolo, Waived  Date Value Ref Range Status  12/03/2015 40 10 - 47 U/L Final         Failed - AST in normal range and within 180 days    AST  Date Value Ref Range Status  11/14/2018  52 (H) 0 - 40 IU/L Final   AST (SGOT) Piccolo, Waived  Date Value Ref Range Status  12/03/2015 35 11 - 38 U/L Final         Passed - Completed PHQ-2 or PHQ-9 in the last 360 days.      Passed - Last BP in normal range    BP Readings from Last 1 Encounters:  11/14/18 136/84         Passed - Valid encounter within last 6 months    Recent Outpatient Visits          3 months ago Essential hypertension, benign   [facility] Merrie Roof [place], [place]   8 months ago Plantar fasciitis   [place], [place], [place]   9 months ago Elevated alkaline phosphatase level   [place], NP   1 year ago Annual physical exam   [facility] Kathrine Haddock, NP   1 year ago Class 1 obesity due to excess calories without serious comorbidity with body mass index (BMI) of 31.0 to 31.9 in adult   [place], NP      Future Appointments            In 2 months Orene Desanctis, Lilia Argue, [place], [place]

## 2019-04-17 ENCOUNTER — Encounter: Payer: Self-pay | Admitting: Family Medicine

## 2019-05-16 ENCOUNTER — Ambulatory Visit (INDEPENDENT_AMBULATORY_CARE_PROVIDER_SITE_OTHER): Payer: BLUE CROSS/BLUE SHIELD | Admitting: Family Medicine

## 2019-05-16 ENCOUNTER — Other Ambulatory Visit: Payer: Self-pay

## 2019-05-16 ENCOUNTER — Encounter: Payer: Self-pay | Admitting: Family Medicine

## 2019-05-16 VITALS — BP 130/87 | HR 87 | Ht 59.0 in | Wt 163.0 lb

## 2019-05-16 DIAGNOSIS — Z789 Other specified health status: Secondary | ICD-10-CM | POA: Diagnosis not present

## 2019-05-16 DIAGNOSIS — I10 Essential (primary) hypertension: Secondary | ICD-10-CM

## 2019-05-16 DIAGNOSIS — E782 Mixed hyperlipidemia: Secondary | ICD-10-CM

## 2019-05-16 DIAGNOSIS — F324 Major depressive disorder, single episode, in partial remission: Secondary | ICD-10-CM

## 2019-05-16 DIAGNOSIS — M19049 Primary osteoarthritis, unspecified hand: Secondary | ICD-10-CM

## 2019-05-16 DIAGNOSIS — R7301 Impaired fasting glucose: Secondary | ICD-10-CM

## 2019-05-16 DIAGNOSIS — F5101 Primary insomnia: Secondary | ICD-10-CM

## 2019-05-16 MED ORDER — PROAIR HFA 108 (90 BASE) MCG/ACT IN AERS: 2.0000 | INHALATION_SPRAY | Freq: Four times a day (QID) | RESPIRATORY_TRACT | 6 refills | Status: AC | PRN

## 2019-05-16 MED ORDER — QUETIAPINE FUMARATE 25 MG PO TABS: 25.0000 mg | ORAL_TABLET | Freq: Every day | ORAL | 2 refills | Status: DC

## 2019-05-16 MED ORDER — VENLAFAXINE HCL ER 150 MG PO CP24: 150.0000 mg | ORAL_CAPSULE | Freq: Every day | ORAL | 1 refills | Status: DC

## 2019-05-16 MED ORDER — LOSARTAN POTASSIUM 50 MG PO TABS: 50.0000 mg | ORAL_TABLET | Freq: Every day | ORAL | 1 refills | Status: DC

## 2019-05-16 MED ORDER — METFORMIN HCL 500 MG PO TABS: 500.0000 mg | ORAL_TABLET | Freq: Two times a day (BID) | ORAL | 2 refills | Status: DC

## 2019-05-16 MED ORDER — AMLODIPINE BESYLATE 5 MG PO TABS: 5.0000 mg | ORAL_TABLET | Freq: Every day | ORAL | 1 refills | Status: DC

## 2019-05-16 MED ORDER — EZETIMIBE 10 MG PO TABS: 10.0000 mg | ORAL_TABLET | Freq: Every day | ORAL | 1 refills | Status: DC

## 2019-05-18 NOTE — Progress Notes (Signed)
BP 130/87 (BP Location: Left Arm, Patient Position: Sitting, Cuff Size: Normal)   Pulse 87   Ht 4\' 11"  (1.499 m)   Wt 163 lb (73.9 kg)   BMI 32.92 kg/m    Subjective:    Patient ID: Marissa Baldwin, female    DOB: 05-13-1966, 53 y.o.   MRN: [id number]  HPI: [name] is a 53 y.o. female  Chief Complaint  Patient presents with  . Depression    6 month F/U. Needs refill on Effexor  . Hyperlipidemia  . Hypertension    Needs Rx for BP Cuff/Machine  . Arthritis    Patient states her hands are stiff in the morning    . This visit was completed via WebEx due to the restrictions of the COVID-19 pandemic. All issues as above were discussed and addressed. Physical exam was done as above through visual confirmation on WebEx. If it was felt that the patient should be evaluated in the office, they were directed there. The patient verbally consented to this visit. . Location of the patient: home . Location of the provider: home . Those involved with this call:  . Provider: Merrie Roof, PA-C . CMA: Merilyn Baba, [place] . Front Desk/Registration: Jill Side  . Time spent on call: 25 minutes with patient face to face via video conference. More than 50% of this time was spent in counseling and coordination of care. 5 minutes total spent in review of patient's record and preparation of their chart. I verified patient identity using two factors (patient name and date of birth). Patient consents verbally to being seen via telemedicine visit today.   Patient presenting for 6 month f/u chronic conditions.   HTN - Not consistently checking home BPs, states she needs a new machine as hers broke. When she is able to check, getting 120s-130s/70s range. Taking her medicine faithfully without side effects. Denies CP, SOB, HAs, dizziness.   IFG - on metformin, trying to watch what she eats and stay active. No side effects, low blood sugar spells. Not checking home BSs.   HLD - statin intolerant, currently  on zetia and tolerating well. Denies CP, SOB, claudication.   Depression/insomnia - doing very well on effexor and seroquel regimen, no side effects and feels moods are stable, sleep is sound. Denies SI/HI.   Main concern today is continued b/l hand pain and stiffness. Diclofenac gel helped in the past, patient admits she forgot about it until asked just now. '  Depression screen [facility] 2/9 05/16/2019 11/14/2018 11/14/2018  Decreased Interest 0 0 0  Down, Depressed, Hopeless 1 1 1   PHQ - 2 Score 1 1 1   Altered sleeping 3 3 -  Tired, decreased energy 0 3 -  Change in appetite 0 0 -  Feeling bad or failure about yourself  1 0 -  Trouble concentrating 3 0 -  Moving slowly or fidgety/restless 1 0 -  Suicidal thoughts 0 0 -  PHQ-9 Score 9 7 -  Difficult doing work/chores Not difficult at all Not difficult at all -  No flowsheet data found.    Relevant past medical, surgical, family and social history reviewed and updated as indicated. Interim medical history since our last visit reviewed. Allergies and medications reviewed and updated.  Review of Systems  Per HPI unless specifically indicated above     Objective:    BP 130/87 (BP Location: Left Arm, Patient Position: Sitting, Cuff Size: Normal)   Pulse 87   Ht 4'  11" (1.499 m)   Wt 163 lb (73.9 kg)   BMI 32.92 kg/m   Wt Readings from Last 3 Encounters:  05/16/19 163 lb (73.9 kg)  11/14/18 171 lb 7 oz (77.8 kg)  06/14/18 173 lb 8 oz (78.7 kg)    Physical Exam Vitals signs and nursing note reviewed.  Constitutional:      General: She is not in acute distress.    Appearance: Normal appearance.  HENT:     Head: Atraumatic.     Right Ear: External ear normal.     Left Ear: External ear normal.     Nose: Nose normal. No congestion.     Mouth/Throat:     Mouth: Mucous membranes are moist.     Pharynx: Oropharynx is clear. No posterior oropharyngeal erythema.  Eyes:     Extraocular Movements: Extraocular movements intact.      Conjunctiva/sclera: Conjunctivae normal.  Neck:     Musculoskeletal: Normal range of motion.  Cardiovascular:     Comments: Unable to assess via virtual visit Pulmonary:     Effort: Pulmonary effort is normal. No respiratory distress.  Musculoskeletal: Normal range of motion.  Skin:    General: Skin is dry.     Findings: No erythema.  Neurological:     Mental Status: She is alert and oriented to person, place, and time.  Psychiatric:        Mood and Affect: Mood normal.        Thought Content: Thought content normal.        Judgment: Judgment normal.     Results for orders placed or performed in visit on 12/30/18  HM DIABETES EYE EXAM  Result Value Ref Range   HM Diabetic Eye Exam No Retinopathy No Retinopathy      Assessment & Plan:   Problem List Items Addressed This Visit      Cardiovascular and Mediastinum   Essential hypertension, benign - Primary   Relevant Medications   losartan (COZAAR) 50 MG tablet   ezetimibe (ZETIA) 10 MG tablet   amLODipine (NORVASC) 5 MG tablet   Other Relevant Orders   Comprehensive metabolic panel     Endocrine   IFG (impaired fasting glucose)    Recheck A1C, adjust as needed. Continue working on lifestyle modifications.       Relevant Orders   HgB A1c     Other   Hyperlipidemia    Recheck lipids, continue zetia and lifestyle modifications      Relevant Medications   losartan (COZAAR) 50 MG tablet   ezetimibe (ZETIA) 10 MG tablet   amLODipine (NORVASC) 5 MG tablet   Other Relevant Orders   Lipid Panel w/o Chol/HDL Ratio   Insomnia    Stable and under good control, continue current regimen      Depression, major, single episode, in partial remission (HCC)    Moods stable and under good control, continue current regimen      Relevant Medications   venlafaxine XR (EFFEXOR-XR) 150 MG 24 hr capsule   Statin intolerance    Other Visit Diagnoses    Hand arthritis       Pt forgot about her diclofenac, will restart that  and continue supportive care       Follow up plan: Return in about 6 months (around 11/16/2019) for CPE.

## 2019-05-18 NOTE — Assessment & Plan Note (Signed)
Recheck lipids, continue zetia and lifestyle modifications

## 2019-05-18 NOTE — Assessment & Plan Note (Signed)
Stable and under good control, continue current regimen 

## 2019-05-18 NOTE — Assessment & Plan Note (Signed)
Moods stable and under good control, continue current regimen

## 2019-05-18 NOTE — Assessment & Plan Note (Signed)
Recheck A1C, adjust as needed. Continue working on lifestyle modifications 

## 2019-06-01 ENCOUNTER — Other Ambulatory Visit: Payer: BLUE CROSS/BLUE SHIELD

## 2019-06-01 ENCOUNTER — Other Ambulatory Visit: Payer: Self-pay

## 2019-06-01 DIAGNOSIS — R7301 Impaired fasting glucose: Secondary | ICD-10-CM | POA: Diagnosis not present

## 2019-06-01 DIAGNOSIS — E782 Mixed hyperlipidemia: Secondary | ICD-10-CM

## 2019-06-01 DIAGNOSIS — I10 Essential (primary) hypertension: Secondary | ICD-10-CM | POA: Diagnosis not present

## 2019-06-02 LAB — LIPID PANEL W/O CHOL/HDL RATIO
Cholesterol, Total: 215 mg/dL — ABNORMAL HIGH (ref 100–199)
HDL: 58 mg/dL (ref >39)
LDL Calculated: 139 mg/dL — ABNORMAL HIGH (ref 0–99)
Triglycerides: 88 mg/dL (ref 0–149)
VLDL Cholesterol Cal: 18 mg/dL (ref 5–40)

## 2019-06-02 LAB — COMPREHENSIVE METABOLIC PANEL
ALT: 72 IU/L — ABNORMAL HIGH (ref 0–32)
AST: 47 IU/L — ABNORMAL HIGH (ref 0–40)
Albumin/Globulin Ratio: 1.7 (ref 1.2–2.2)
Albumin: 4.8 g/dL (ref 3.8–4.9)
Alkaline Phosphatase: 106 IU/L (ref 39–117)
BUN/Creatinine Ratio: 43 — ABNORMAL HIGH (ref 9–23)
BUN: 23 mg/dL (ref 6–24)
Bilirubin Total: 0.5 mg/dL (ref 0.0–1.2)
CO2: 24 mmol/L (ref 20–29)
Calcium: 9.6 mg/dL (ref 8.7–10.2)
Chloride: 101 mmol/L (ref 96–106)
Creatinine, Ser: 0.53 mg/dL — ABNORMAL LOW (ref 0.57–1.00)
GFR calc Af Amer: 125 mL/min/{1.73_m2} (ref >59)
GFR calc non Af Amer: 109 mL/min/{1.73_m2} (ref >59)
Globulin, Total: 2.9 g/dL (ref 1.5–4.5)
Glucose: 134 mg/dL — ABNORMAL HIGH (ref 65–99)
Potassium: 4.6 mmol/L (ref 3.5–5.2)
Sodium: 140 mmol/L (ref 134–144)
Total Protein: 7.7 g/dL (ref 6.0–8.5)

## 2019-06-02 LAB — HEMOGLOBIN A1C
Est. average glucose Bld gHb Est-mCnc: 134 mg/dL
Hgb A1c MFr Bld: 6.3 % — ABNORMAL HIGH (ref 4.8–5.6)

## 2019-06-27 DIAGNOSIS — E119 Type 2 diabetes mellitus without complications: Secondary | ICD-10-CM | POA: Diagnosis not present

## 2019-06-27 LAB — HM DIABETES EYE EXAM

## 2019-07-17 DIAGNOSIS — Z23 Encounter for immunization: Secondary | ICD-10-CM | POA: Diagnosis not present

## 2019-08-14 ENCOUNTER — Other Ambulatory Visit: Payer: Self-pay | Admitting: Family Medicine

## 2019-09-04 ENCOUNTER — Encounter: Payer: Self-pay | Admitting: Family Medicine

## 2019-09-04 ENCOUNTER — Telehealth: Payer: Self-pay | Admitting: Family Medicine

## 2019-09-04 NOTE — Telephone Encounter (Signed)
Pt would like to know if provider could prescribed something for possible arthritis in her fingers?   Pt says that her hands hurt.   Pharmacy: [facility]

## 2019-09-05 MED ORDER — MELOXICAM 15 MG PO TABS: ORAL_TABLET | ORAL | 1 refills | Status: DC

## 2019-09-05 MED ORDER — OMEPRAZOLE 40 MG PO CPDR: 40.0000 mg | DELAYED_RELEASE_CAPSULE | Freq: Every day | ORAL | 3 refills | Status: DC

## 2019-09-05 NOTE — Telephone Encounter (Signed)
Called and spoke to patient and give her Rachel's message. Patient states she wants to try the meloxicam and prilosec and would like them sent to [facility]. Advised patient that Apolonio Schneiders is not in this afternoon and that she would be available in the morning.

## 2019-09-05 NOTE — Telephone Encounter (Signed)
I had given her the voltaren gel when I last saw her in July, is that not working? That and tylenol will be her best bet at this point

## 2019-09-05 NOTE — Telephone Encounter (Signed)
Patient states that she has been using it at night and that she doesn't use it during the day because she don't want to harm her dogs. She states that it still don't help when she uses it.   Is there anything else that she can have?

## 2019-09-05 NOTE — Telephone Encounter (Signed)
She had documented GI upset with oral NSAIDs which was why we were using the topical version, which should be perfectly fine for her to use around her dogs throughout the day. I am happy to write for some low dose meloxicam and get her on some prilosec daily while taking to protect her stomach. I would say if those things are not helping it's time to speak with an orthopedist about her ongoing issues with hand pain

## 2019-09-05 NOTE — Telephone Encounter (Signed)
Rxs sent

## 2019-10-25 ENCOUNTER — Other Ambulatory Visit: Payer: Self-pay | Admitting: Family Medicine

## 2019-10-25 MED ORDER — VENLAFAXINE HCL ER 150 MG PO CP24: 150.0000 mg | ORAL_CAPSULE | Freq: Every day | ORAL | 0 refills | Status: DC

## 2019-10-25 NOTE — Telephone Encounter (Signed)
Pt request refill  venlafaxine XR (EFFEXOR-XR) 150 MG 24 hr capsule   [facility] [id number]ina [address]on, [place] - [id number] AT [place] Phone:  [phone]  Fax:  [phone]     This was set to another pharmacy last time

## 2019-10-31 ENCOUNTER — Telehealth: Payer: Self-pay | Admitting: Internal Medicine

## 2019-10-31 NOTE — Telephone Encounter (Signed)
Paper work has been received and will be completed after 11/06/2019 phone visit.

## 2019-11-06 ENCOUNTER — Encounter: Payer: Self-pay | Admitting: Acute Care

## 2019-11-06 ENCOUNTER — Ambulatory Visit (INDEPENDENT_AMBULATORY_CARE_PROVIDER_SITE_OTHER): Payer: BC Managed Care – PPO | Admitting: Acute Care

## 2019-11-06 DIAGNOSIS — G4733 Obstructive sleep apnea (adult) (pediatric): Secondary | ICD-10-CM

## 2019-11-06 DIAGNOSIS — Z9989 Dependence on other enabling machines and devices: Secondary | ICD-10-CM | POA: Diagnosis not present

## 2019-11-06 NOTE — Progress Notes (Signed)
Virtual Visit via Telephone Note  I connected with Marissa Baldwin on 11/06/19 at  9:00 AM EST by telephone and verified that I am speaking with the correct person using two identifiers.  Location: Patient: At home Provider: [address] [place], [place], [address]   I discussed the limitations, risks, security and privacy concerns of performing an evaluation and management service by telephone and the availability of in person appointments. I also discussed with the patient that there may be a patient responsible charge related to this service. The patient expressed understanding and agreed to proceed.  Last OV 06/2017 with Dr. Mortimer Fries  Synopsis The patient is a 54 yo female she has been diagnosed with OSA, on CPAP. She is followed by Dr. Mortimer Fries. She also has asthma per her medical record. She uses  Per home sleep study 05/2017>> Severe OSA with hypopneas causing severe oxygen desaturations  History of Present Illness: Pt. Presents for follow up. She was last seen in the clinic 06/2017, with CPAP down Load . She needs a visit to receive her CPAP supplies. She states she has been using her CPAP, but per her down Load she has not been using it every night. She states she needs new equipment. She finds that her mask is leaving her dry, and she states because of that she wakes up dry. She is not using her humidifier. She states she has been using cheaper tubing that she has purchased on line.  No night time headaches She states when she does not use the CPAP she does have daytime sleepiness.  We reviewed the risks of not using her CPAP machine with her severe OSA. She verbalized understanding. She agreed to a 3 month follow up tele visit for compliance review   Observations/Objective: Down Load 10/04/2019-11/02/2019 Air Sense 10 AutoSet Set Pressure of 11 cm H2O Usage days 19/30 ( 63%) > 4 hours 12 Days ( 40%) < 4 Hours 7 Days ( 23%) Average Use days used 4 hours 57 minutes AHI  2.4  Assessment and Plan: OSA on CPAP Minimal follow up since 06/2017 Down Load shows moderate compliance Plan We will place an order for CPAP supplies with your DME Continue on CPAP at bedtime. You appear to be benefiting from the treatment  Goal is to wear for at least 6 hours each night for maximal clinical benefit. Continue to work on weight loss, as the link between excess weight  and sleep apnea is well established.   Remember to establish a good bedtime routine, and work on sleep hygiene.  Limit daytime naps , avoid stimulants such as caffeine and nicotine close to bedtime, exercise daily to promote sleep quality, avoid heavy , spicy, fried , or rich foods before bed. Ensure adequate exposure to natural light during the day,establish a relaxing bedtime routine with a pleasant sleep environment ( Bedroom between 60 and 67 degrees, turn off bright lights , TV or device screens screens , consider black out curtains or white noise machines) Do not drive if sleepy. Remember to clean mask, tubing, filter, and reservoir once weekly with soapy water.  Follow up with Dr. Mortimer Fries   In 3 months  or before as needed. Pt. educated on the cardiovascular and stroke  risks of non compliance. Please contact office for sooner follow up if symptoms do not improve or worsen or seek emergency care   Home Sleep Study 06/05/2017 184 hypopneas ( 19.1 per hour) 503 apnea + hypopnea events 215 obstructive 71  central 33 mixed 184 Hypopneas for an apnea hypopnea index of 52.2 per hour Lowest sat recorded was 78%, with an average of 93%.  Follow Up Instructions: Follow up with Dr. Mortimer Fries in 3 months to ensure improved usage for severe sleep apnea We will place order through [employer] for supplies       I discussed the assessment and treatment plan with the patient. The patient was provided an opportunity to ask questions and all were answered. The patient agreed with the plan and demonstrated an understanding of  the instructions.   The patient was advised to call back or seek an in-person evaluation if the symptoms worsen or if the condition fails to improve as anticipated.  25 minutes of care provided this visit   Magdalen Spatz, NP 11/06/2019  9:18 AM

## 2019-11-06 NOTE — Patient Instructions (Signed)
It was good to talk with you today We will place an order for CPAP supplies with your DME Continue on CPAP at bedtime. You appear to be benefiting from the treatment  Goal is to wear for at least 6 hours each night for maximal clinical benefit. Continue to work on weight loss, as the link between excess weight  and sleep apnea is well established.   Remember to establish a good bedtime routine, and work on sleep hygiene.  Limit daytime naps , avoid stimulants such as caffeine and nicotine close to bedtime, exercise daily to promote sleep quality, avoid heavy , spicy, fried , or rich foods before bed. Ensure adequate exposure to natural light during the day,establish a relaxing bedtime routine with a pleasant sleep environment ( Bedroom between 60 and 67 degrees, turn off bright lights , TV or device screens screens , consider black out curtains or white noise machines) Do not drive if sleepy. Remember to clean mask, tubing, filter, and reservoir once weekly with soapy water.  Follow up with Dr. Mortimer Fries   In 3 months  or before as needed. Pt. educated on the cardiovascular and stroke  risks of non compliance. Please contact office for sooner follow up if symptoms do not improve or worsen or seek emergency care

## 2019-11-07 ENCOUNTER — Other Ambulatory Visit: Payer: Self-pay | Admitting: Family Medicine

## 2019-11-07 NOTE — Telephone Encounter (Signed)
Requested Prescriptions  Pending Prescriptions Disp Refills  . meloxicam (MOBIC) 15 MG tablet [Pharmacy Med Name: MELOXICAM 15MG  TABLETS] 30 tablet 1    Sig: TAKE 1/2 TO 1 TABLET BY MOUTH ONCE DAILY AS NEEDED     Analgesics:  COX2 Inhibitors Failed - 11/07/2019  3:39 AM      Failed - Cr in normal range and within 360 days    Creat  Date Value Ref Range Status  05/14/2016 0.62 0.50 - 1.05 mg/dL Final    Comment:      For patients > or = 54 years of age: The upper reference limit for Creatinine is approximately 13% higher for people identified as African-American.      Creatinine, Ser  Date Value Ref Range Status  06/01/2019 0.53 (L) 0.57 - 1.00 mg/dL Final         Passed - HGB in normal range and within 360 days    Hemoglobin  Date Value Ref Range Status  11/14/2018 14.6 11.1 - 15.9 g/dL Final         Passed - Patient is not pregnant      Passed - Valid encounter within last 12 months    Recent Outpatient Visits          5 months ago Essential hypertension, benign   [facility] Merrie Roof [place], [place]   11 months ago Essential hypertension, benign   [place], [place], [place]   1 year ago Plantar fasciitis   [facility] Merrie Roof [place], [place]   1 year ago Elevated alkaline phosphatase level   [facility] Kathrine Haddock, NP   1 year ago Annual physical exam   [facility] Kathrine Haddock, NP

## 2019-11-15 ENCOUNTER — Telehealth: Payer: Self-pay | Admitting: Acute Care

## 2019-11-15 NOTE — Telephone Encounter (Signed)
CM sent to [facility] [employer] Chyle as to the status of order. Per Sonia Baller with [employer] "she sent this order to the resupply team on 1/18, and she sent them a f/u email to contact patient ASAP."  Called and spoke with patient and she is aware that order was sent by [place] on 11/06/2019 and that [employer] should reach out to her by this week. Advised patient that if she hasn't heard anything by the first part of next week to contact us back. Pt voiced understanding and stated that she would. Nothing else is needed at this time. Rhonda J Cobb

## 2019-11-15 NOTE — Telephone Encounter (Signed)
Called and spoke to pt, who is requesting update on cpap supplies.  Order was placed to [employer] on 11/06/2019. Pt stated that [employer] has not reached out to her as of yet.   Suanne Marker, can you help with this? Thanks

## 2019-11-15 NOTE — Telephone Encounter (Signed)
CM sent to [facility] [employer] Chyle as to the status of order for CPAP supplies placed on 11/06/2019.  Waiting on response. Rhonda J Cobb

## 2019-11-16 ENCOUNTER — Encounter: Payer: Self-pay | Admitting: Family Medicine

## 2019-11-22 DIAGNOSIS — G4733 Obstructive sleep apnea (adult) (pediatric): Secondary | ICD-10-CM | POA: Diagnosis not present

## 2019-11-28 DIAGNOSIS — G4733 Obstructive sleep apnea (adult) (pediatric): Secondary | ICD-10-CM | POA: Diagnosis not present

## 2019-12-21 ENCOUNTER — Encounter: Payer: Self-pay | Admitting: Family Medicine

## 2019-12-21 ENCOUNTER — Other Ambulatory Visit: Payer: Self-pay

## 2019-12-21 ENCOUNTER — Ambulatory Visit (INDEPENDENT_AMBULATORY_CARE_PROVIDER_SITE_OTHER): Payer: BC Managed Care – PPO | Admitting: Family Medicine

## 2019-12-21 VITALS — BP 127/87 | HR 69 | Temp 98.5°F | Ht <= 58 in | Wt 159.0 lb

## 2019-12-21 DIAGNOSIS — Z1239 Encounter for other screening for malignant neoplasm of breast: Secondary | ICD-10-CM

## 2019-12-21 DIAGNOSIS — R7301 Impaired fasting glucose: Secondary | ICD-10-CM

## 2019-12-21 DIAGNOSIS — E669 Obesity, unspecified: Secondary | ICD-10-CM

## 2019-12-21 DIAGNOSIS — Z23 Encounter for immunization: Secondary | ICD-10-CM

## 2019-12-21 DIAGNOSIS — E782 Mixed hyperlipidemia: Secondary | ICD-10-CM | POA: Diagnosis not present

## 2019-12-21 DIAGNOSIS — J4520 Mild intermittent asthma, uncomplicated: Secondary | ICD-10-CM | POA: Diagnosis not present

## 2019-12-21 DIAGNOSIS — F5101 Primary insomnia: Secondary | ICD-10-CM

## 2019-12-21 DIAGNOSIS — E119 Type 2 diabetes mellitus without complications: Secondary | ICD-10-CM

## 2019-12-21 DIAGNOSIS — I10 Essential (primary) hypertension: Secondary | ICD-10-CM | POA: Diagnosis not present

## 2019-12-21 DIAGNOSIS — Z0000 Encounter for general adult medical examination without abnormal findings: Secondary | ICD-10-CM | POA: Diagnosis not present

## 2019-12-21 DIAGNOSIS — Z789 Other specified health status: Secondary | ICD-10-CM

## 2019-12-21 DIAGNOSIS — G4733 Obstructive sleep apnea (adult) (pediatric): Secondary | ICD-10-CM

## 2019-12-21 DIAGNOSIS — F324 Major depressive disorder, single episode, in partial remission: Secondary | ICD-10-CM

## 2019-12-21 DIAGNOSIS — M25532 Pain in left wrist: Secondary | ICD-10-CM | POA: Diagnosis not present

## 2019-12-21 LAB — UA/M W/RFLX CULTURE, ROUTINE
Bilirubin, UA: NEGATIVE
Glucose, UA: NEGATIVE
Ketones, UA: NEGATIVE
Leukocytes,UA: NEGATIVE
Nitrite, UA: NEGATIVE
Protein,UA: NEGATIVE
RBC, UA: NEGATIVE
Specific Gravity, UA: 1.02 (ref 1.005–1.030)
Urobilinogen, Ur: 0.2 mg/dL (ref 0.2–1.0)
pH, UA: 8.5 — ABNORMAL HIGH (ref 5.0–7.5)

## 2019-12-21 MED ORDER — QUETIAPINE FUMARATE 25 MG PO TABS: 25.0000 mg | ORAL_TABLET | Freq: Every day | ORAL | 2 refills | Status: DC

## 2019-12-21 MED ORDER — AMLODIPINE BESYLATE 5 MG PO TABS: 5.0000 mg | ORAL_TABLET | Freq: Every day | ORAL | 1 refills | Status: DC

## 2019-12-21 MED ORDER — TRIAMCINOLONE ACETONIDE 40 MG/ML IJ SUSP
40.0000 mg | Freq: Once | INTRAMUSCULAR | Status: AC
  Administered 2019-12-21: 40 mg via INTRAMUSCULAR

## 2019-12-21 MED ORDER — METFORMIN HCL ER 500 MG PO TB24: 500.0000 mg | ORAL_TABLET | Freq: Every day | ORAL | 2 refills | Status: DC

## 2019-12-21 MED ORDER — EZETIMIBE 10 MG PO TABS: 10.0000 mg | ORAL_TABLET | Freq: Every day | ORAL | 1 refills | Status: DC

## 2019-12-21 MED ORDER — LOSARTAN POTASSIUM 50 MG PO TABS: 50.0000 mg | ORAL_TABLET | Freq: Every day | ORAL | 1 refills | Status: DC

## 2019-12-21 MED ORDER — VENLAFAXINE HCL ER 150 MG PO CP24: 150.0000 mg | ORAL_CAPSULE | Freq: Every day | ORAL | 1 refills | Status: DC

## 2019-12-21 NOTE — Assessment & Plan Note (Signed)
Stable and under good control, continue current regimen 

## 2019-12-21 NOTE — Progress Notes (Signed)
BP 127/87   Pulse 69   Temp 98.5 F (36.9 C) (Oral)   Ht 4\' 10"  (1.473 m)   Wt 159 lb (72.1 kg)   SpO2 98%   BMI 33.23 kg/m    Subjective:    Patient ID: Marissa Baldwin, female    DOB: 04-24-1966, 54 y.o.   MRN: [id number]  HPI: Marissa Baldwin is a 54 y.o. female presenting on 12/21/2019 for comprehensive medical examination. Current medical complaints include:see below  Left medial wrist pain and numbness x 2 months. No known injury, swelling, redness. Wearing brace, trying diclofenac gel and meloxicam prn, trying not to type with that hand or lift. So far no benefit from these.   DM - staying nauseated, wondering if it's the metformin. Sugars running around 90-120 typically. Trying to eat well and stay active.   HTN - Does not regularly check home BPs. Taking medications faithfully without side effects. Denies Cp, SOB, HAs, dizziness.   Effexor and seroquel for anxiety and depression - stable and improved with these. Takes the seroquel just as needed. Sometimes still feels groggy with the seroquel in the mornings so doesn't take it every day.   She currently lives with: Menopausal Symptoms: no  Depression Screen done today and results listed below:  Depression screen [facility] 2/9 12/21/2019 05/16/2019 11/14/2018 11/14/2018 06/14/2018  Decreased Interest 0 0 0 0 1  Down, Depressed, Hopeless 0 1 1 1 2   PHQ - 2 Score 0 1 1 1 3   Altered sleeping 1 3 3  - 1  Tired, decreased energy 2 0 3 - 3  Change in appetite 1 0 0 - 3  Feeling bad or failure about yourself  0 1 0 - 2  Trouble concentrating 1 3 0 - 3  Moving slowly or fidgety/restless 0 1 0 - 2  Suicidal thoughts 0 0 0 - 0  PHQ-9 Score 5 9 7  - 17  Difficult doing work/chores - Not difficult at all Not difficult at all - -    The patient does not have a history of falls. I did complete a risk assessment for falls. A plan of care for falls was documented.   Past Medical History:  Past Medical History:  Diagnosis Date  . Anxiety   .  Arthritis 11/19   Hands hip shoulders  . BPPV (benign paroxysmal positional vertigo)   . Depression 04/2018  . Golfers elbow of left upper extremity   . Hyperlipidemia   . Hypertension   . Macular degeneration   . Morton's neuroma of right foot   . Postmenopausal symptoms   . Sleep apnea    CPAP    Surgical History:  Past Surgical History:  Procedure Laterality Date  . COLONOSCOPY WITH PROPOFOL N/A 07/20/2016   Procedure: COLONOSCOPY WITH PROPOFOL;  Surgeon: Lucilla Lame, MD;  Location: [place];  Service: Endoscopy;  Laterality: N/A;  sleep apnea  . ESOPHAGOGASTRODUODENOSCOPY    . POLYPECTOMY  07/20/2016   Procedure: POLYPECTOMY;  Surgeon: Lucilla Lame, MD;  Location: [place];  Service: Endoscopy;;    Medications:  Current Outpatient Medications on File Prior to Visit  Medication Sig  . acetaminophen (TYLENOL) 500 MG tablet Take 500 mg by mouth every 6 (six) hours as needed.  . AMBULATORY NON FORMULARY MEDICATION Medication Name: Please dispense cpap mask of choice and new supplies. DX G47.33  . diclofenac sodium (VOLTAREN) 1 % GEL Apply 4 g topically 4 (four) times daily.  . meloxicam (MOBIC)  15 MG tablet TAKE 1/2 TO 1 TABLET BY MOUTH ONCE DAILY AS NEEDED  . Multiple Vitamins-Minerals (ICAPS AREDS 2 PO) Take by mouth 2 (two) times daily.  [name] Kitchen PROAIR HFA 108 (90 Base) MCG/ACT inhaler Inhale 2 puffs into the lungs every 6 (six) hours as needed for wheezing or shortness of breath.   No current facility-administered medications on file prior to visit.    Allergies:  Allergies  Allergen Reactions  . Meloxicam Other (See Comments)    GI upset and pain  . Statins Other (See Comments)    Muscle swelling  . Sulfa Antibiotics Swelling    Mouth swelling    Social History:  Social History   Socioeconomic History  . Marital status: Married    Spouse name: Not on file  . Number of children: Not on file  . Years of education: Not on file  . Highest  education level: Not on file  Occupational History  . Not on file  Tobacco Use  . Smoking status: Never Smoker  . Smokeless tobacco: Never Used  Substance and Sexual Activity  . Alcohol use: Yes    Comment: occasional  . Drug use: No  . Sexual activity: Yes    Birth control/protection: None  Other Topics Concern  . Not on file  Social History Narrative  . Not on file   Social Determinants of Health   Financial Resource Strain:   . Difficulty of Paying Living Expenses: Not on file  Food Insecurity:   . Worried About Charity fundraiser in the Last Year: Not on file  . Ran Out of Food in the Last Year: Not on file  Transportation Needs:   . Lack of Transportation (Medical): Not on file  . Lack of Transportation (Non-Medical): Not on file  Physical Activity:   . Days of Exercise per Week: Not on file  . Minutes of Exercise per Session: Not on file  Stress:   . Feeling of Stress : Not on file  Social Connections:   . Frequency of Communication with Friends and Family: Not on file  . Frequency of Social Gatherings with Friends and Family: Not on file  . Attends Religious Services: Not on file  . Active Member of Clubs or Organizations: Not on file  . Attends Archivist Meetings: Not on file  . Marital Status: Not on file  Intimate Partner Violence:   . Fear of Current or Ex-Partner: Not on file  . Emotionally Abused: Not on file  . Physically Abused: Not on file  . Sexually Abused: Not on file   Social History   Tobacco Use  Smoking Status Never Smoker  Smokeless Tobacco Never Used   Social History   Substance and Sexual Activity  Alcohol Use Yes   Comment: occasional    Family History:  Family History  Problem Relation Age of Onset  . Diabetes Father   . Hypertension Father   . Heart disease Father   . Stroke Father        mini strokes  . Neuropathy Father   . Depression Father   . Cancer Maternal Aunt        rectal  . Varicose Veins  Maternal Grandmother   . Diabetes Maternal Grandfather   . Heart disease Paternal Grandfather   . COPD Neg Hx     Past medical history, surgical history, medications, allergies, family history and social history reviewed with patient today and changes made to appropriate areas of  the chart.   Review of Systems - General ROS: negative Psychological ROS: negative Ophthalmic ROS: negative ENT ROS: negative Allergy and Immunology ROS: negative Hematological and Lymphatic ROS: negative Endocrine ROS: negative Breast ROS: negative for breast lumps Respiratory ROS: no cough, shortness of breath, or wheezing Cardiovascular ROS: no chest pain or dyspnea on exertion Gastrointestinal ROS: no abdominal pain, change in bowel habits, or black or bloody stools Genito-Urinary ROS: no dysuria, trouble voiding, or hematuria Musculoskeletal ROS: negative Neurological ROS: no TIA or stroke symptoms Dermatological ROS: negative All other ROS negative except what is listed above and in the HPI.      Objective:    BP 127/87   Pulse 69   Temp 98.5 F (36.9 C) (Oral)   Ht 4\' 10"  (1.473 m)   Wt 159 lb (72.1 kg)   SpO2 98%   BMI 33.23 kg/m   Wt Readings from Last 3 Encounters:  12/21/19 159 lb (72.1 kg)  05/16/19 163 lb (73.9 kg)  11/14/18 171 lb 7 oz (77.8 kg)    Physical Exam Vitals and nursing note reviewed.  Constitutional:      General: She is not in acute distress.    Appearance: She is well-developed.  HENT:     Head: Atraumatic.     Right Ear: External ear normal.     Left Ear: External ear normal.     Nose: Nose normal.     Mouth/Throat:     Pharynx: No oropharyngeal exudate.  Eyes:     General: No scleral icterus.    Conjunctiva/sclera: Conjunctivae normal.     Pupils: Pupils are equal, round, and reactive to light.  Neck:     Thyroid: No thyromegaly.  Cardiovascular:     Rate and Rhythm: Normal rate and regular rhythm.     Heart sounds: Normal heart sounds.  Pulmonary:       Effort: Pulmonary effort is normal. No respiratory distress.     Breath sounds: Normal breath sounds.  Chest:     Breasts:        Right: No mass, skin change or tenderness.        Left: No mass, skin change or tenderness.  Abdominal:     General: Bowel sounds are normal.     Palpations: Abdomen is soft. There is no mass.     Tenderness: There is no abdominal tenderness. There is no guarding.  Genitourinary:    Comments: GU exam declined Musculoskeletal:        General: No tenderness. Normal range of motion.     Cervical back: Normal range of motion and neck supple.  Lymphadenopathy:     Cervical: No cervical adenopathy.     Upper Body:     Right upper body: No axillary adenopathy.     Left upper body: No axillary adenopathy.  Skin:    General: Skin is warm and dry.     Findings: No rash.  Neurological:     Mental Status: She is alert and oriented to person, place, and time.     Cranial Nerves: No cranial nerve deficit.  Psychiatric:        Behavior: Behavior normal.     Results for orders placed or performed in visit on 12/21/19  CBC with Differential/Platelet  Result Value Ref Range   WBC 5.6 3.4 - 10.8 x10E3/uL   RBC 4.44 3.77 - 5.28 x10E6/uL   Hemoglobin 13.6 11.1 - 15.9 g/dL   Hematocrit 40.5 34.0 - 46.6 %  MCV 91 79 - 97 fL   MCH 30.6 26.6 - 33.0 pg   MCHC 33.6 31.5 - 35.7 g/dL   RDW 12.7 11.7 - 15.4 %   Platelets 350 150 - 450 x10E3/uL   Neutrophils 58 Not Estab. %   Lymphs 34 Not Estab. %   Monocytes 5 Not Estab. %   Eos 2 Not Estab. %   Basos 1 Not Estab. %   Neutrophils Absolute 3.2 1.4 - 7.0 x10E3/uL   Lymphocytes Absolute 1.9 0.7 - 3.1 x10E3/uL   Monocytes Absolute 0.3 0.1 - 0.9 x10E3/uL   EOS (ABSOLUTE) 0.1 0.0 - 0.4 x10E3/uL   Basophils Absolute 0.1 0.0 - 0.2 x10E3/uL   Immature Granulocytes 0 Not Estab. %   Immature Grans (Abs) 0.0 0.0 - 0.1 x10E3/uL  Comprehensive metabolic panel  Result Value Ref Range   Glucose 103 (H) 65 - 99 mg/dL    BUN 22 6 - 24 mg/dL   Creatinine, Ser 0.61 0.57 - 1.00 mg/dL   GFR calc non Af Amer 104 >59 mL/min/1.73   GFR calc Af Amer 120 >59 mL/min/1.73   BUN/Creatinine Ratio 36 (H) 9 - 23   Sodium 138 134 - 144 mmol/L   Potassium 4.1 3.5 - 5.2 mmol/L   Chloride 102 96 - 106 mmol/L   CO2 22 20 - 29 mmol/L   Calcium 9.2 8.7 - 10.2 mg/dL   Total Protein 7.3 6.0 - 8.5 g/dL   Albumin 4.6 3.8 - 4.9 g/dL   Globulin, Total 2.7 1.5 - 4.5 g/dL   Albumin/Globulin Ratio 1.7 1.2 - 2.2   Bilirubin Total 0.5 0.0 - 1.2 mg/dL   Alkaline Phosphatase 105 39 - 117 IU/L   AST 23 0 - 40 IU/L   ALT 32 0 - 32 IU/L  Lipid Panel w/o Chol/HDL Ratio  Result Value Ref Range   Cholesterol, Total 193 100 - 199 mg/dL   Triglycerides 65 0 - 149 mg/dL   HDL 65 >39 mg/dL   VLDL Cholesterol Cal 12 5 - 40 mg/dL   LDL Chol Calc (NIH) 116 (H) 0 - 99 mg/dL  TSH  Result Value Ref Range   TSH 0.654 0.450 - 4.500 uIU/mL  UA/M w/rflx Culture, Routine   Specimen: Urine   URINE  Result Value Ref Range   Specific Gravity, UA 1.020 1.005 - 1.030   pH, UA 8.5 (H) 5.0 - 7.5   Color, UA Yellow Yellow   Appearance Ur Clear Clear   Leukocytes,UA Negative Negative   Protein,UA Negative Negative/Trace   Glucose, UA Negative Negative   Ketones, UA Negative Negative   RBC, UA Negative Negative   Bilirubin, UA Negative Negative   Urobilinogen, Ur 0.2 0.2 - 1.0 mg/dL   Nitrite, UA Negative Negative  HgB A1c  Result Value Ref Range   Hgb A1c MFr Bld 6.1 (H) 4.8 - 5.6 %   Est. average glucose Bld gHb Est-mCnc 128 mg/dL      Assessment & Plan:   Problem List Items Addressed This Visit      Cardiovascular and Mediastinum   Essential hypertension, benign    BPs stable and WNL, continue current regimen      Relevant Medications   amLODipine (NORVASC) 5 MG tablet   ezetimibe (ZETIA) 10 MG tablet   losartan (COZAAR) 50 MG tablet   Other Relevant Orders   CBC with Differential/Platelet (Completed)   Comprehensive metabolic  panel (Completed)   TSH (Completed)   UA/M w/rflx Culture, Routine (  Completed)     Respiratory   Sleep apnea    Stable, continue current regimen. Wears CPAP      Asthma    Stable without recent exacerbations. Continue current regimen        Endocrine   Diabetes mellitus without complication (HCC)   Relevant Medications   metFORMIN (GLUCOPHAGE XR) 500 MG 24 hr tablet   losartan (COZAAR) 50 MG tablet     Other   Hyperlipidemia    Recheck lipids, adjust as needed. Continue current regimen. Statin intolerant      Relevant Medications   amLODipine (NORVASC) 5 MG tablet   ezetimibe (ZETIA) 10 MG tablet   losartan (COZAAR) 50 MG tablet   Other Relevant Orders   Lipid Panel w/o Chol/HDL Ratio (Completed)   Insomnia    Stable and well controlled, continue current regimen      Obesity (BMI 30-39.9)    Diet and exercise reviewed      Relevant Medications   metFORMIN (GLUCOPHAGE XR) 500 MG 24 hr tablet   Depression, major, single episode, in partial remission (HCC)    Stable and under good control, continue current regimen      Relevant Medications   venlafaxine XR (EFFEXOR-XR) 150 MG 24 hr capsule   Statin intolerance    On zetia for HLD       Other Visit Diagnoses    Left wrist pain    -  Primary   Suspect carpal tunnel. IM kenalog given today, continue brace, voltaren gel, meloxicam/tylenol prn, activity modification. F/u if not improving.    Relevant Medications   triamcinolone acetonide (KENALOG-40) injection 40 mg (Completed)   Annual physical exam       Encounter for screening for malignant neoplasm of breast, unspecified screening modality       Relevant Orders   MM DIGITAL SCREENING BILATERAL   Need for pneumococcal vaccine       Relevant Orders   Pneumococcal polysaccharide vaccine 23-valent greater than or equal to 2yo subcutaneous/IM (Completed)       Follow up plan: Return in about 6 months (around 06/22/2020) for 6 month f/u.   LABORATORY  TESTING:  - Pap smear: up to date  IMMUNIZATIONS:   - Tdap: Tetanus vaccination status reviewed: last tetanus booster within 10 years. - Influenza: Up to date - Pneumovax: Administered today - Prevnar: Not applicable - HPV: Not applicable - Zostavax vaccine: Not applicable  SCREENING: -Mammogram: Ordered today  - Colonoscopy: Up to date   PATIENT COUNSELING:   Advised to take 1 mg of folate supplement per day if capable of pregnancy.   Sexuality: Discussed sexually transmitted diseases, partner selection, use of condoms, avoidance of unintended pregnancy  and contraceptive alternatives.   Advised to avoid cigarette smoking.  I discussed with the patient that most people either abstain from alcohol or drink within safe limits (<=14/week and <=4 drinks/occasion for males, <=7/weeks and <= 3 drinks/occasion for females) and that the risk for alcohol disorders and other health effects rises proportionally with the number of drinks per week and how often a drinker exceeds daily limits.  Discussed cessation/primary prevention of drug use and availability of treatment for abuse.   Diet: Encouraged to adjust caloric intake to maintain  or achieve ideal body weight, to reduce intake of dietary saturated fat and total fat, to limit sodium intake by avoiding high sodium foods and not adding table salt, and to maintain adequate dietary potassium and calcium preferably from fresh fruits, vegetables,  and low-fat dairy products.    stressed the importance of regular exercise  Injury prevention: Discussed safety belts, safety helmets, smoke detector, smoking near bedding or upholstery.   Dental health: Discussed importance of regular tooth brushing, flossing, and dental visits.    NEXT PREVENTATIVE PHYSICAL DUE IN 1 YEAR. Return in about 6 months (around 06/22/2020) for 6 month f/u.

## 2019-12-21 NOTE — Assessment & Plan Note (Signed)
On zetia for HLD

## 2019-12-22 ENCOUNTER — Encounter: Payer: Self-pay | Admitting: Family Medicine

## 2019-12-22 ENCOUNTER — Other Ambulatory Visit: Payer: Self-pay | Admitting: Family Medicine

## 2019-12-22 LAB — COMPREHENSIVE METABOLIC PANEL
ALT: 32 IU/L (ref 0–32)
AST: 23 IU/L (ref 0–40)
Albumin/Globulin Ratio: 1.7 (ref 1.2–2.2)
Albumin: 4.6 g/dL (ref 3.8–4.9)
Alkaline Phosphatase: 105 IU/L (ref 39–117)
BUN/Creatinine Ratio: 36 — ABNORMAL HIGH (ref 9–23)
BUN: 22 mg/dL (ref 6–24)
Bilirubin Total: 0.5 mg/dL (ref 0.0–1.2)
CO2: 22 mmol/L (ref 20–29)
Calcium: 9.2 mg/dL (ref 8.7–10.2)
Chloride: 102 mmol/L (ref 96–106)
Creatinine, Ser: 0.61 mg/dL (ref 0.57–1.00)
GFR calc Af Amer: 120 mL/min/{1.73_m2} (ref >59)
GFR calc non Af Amer: 104 mL/min/{1.73_m2} (ref >59)
Globulin, Total: 2.7 g/dL (ref 1.5–4.5)
Glucose: 103 mg/dL — ABNORMAL HIGH (ref 65–99)
Potassium: 4.1 mmol/L (ref 3.5–5.2)
Sodium: 138 mmol/L (ref 134–144)
Total Protein: 7.3 g/dL (ref 6.0–8.5)

## 2019-12-22 LAB — CBC WITH DIFFERENTIAL/PLATELET
Basophils Absolute: 0.1 10*3/uL (ref 0.0–0.2)
Basos: 1 %
EOS (ABSOLUTE): 0.1 10*3/uL (ref 0.0–0.4)
Eos: 2 %
Hematocrit: 40.5 % (ref 34.0–46.6)
Hemoglobin: 13.6 g/dL (ref 11.1–15.9)
Immature Grans (Abs): 0 10*3/uL (ref 0.0–0.1)
Immature Granulocytes: 0 %
Lymphocytes Absolute: 1.9 10*3/uL (ref 0.7–3.1)
Lymphs: 34 %
MCH: 30.6 pg (ref 26.6–33.0)
MCHC: 33.6 g/dL (ref 31.5–35.7)
MCV: 91 fL (ref 79–97)
Monocytes Absolute: 0.3 10*3/uL (ref 0.1–0.9)
Monocytes: 5 %
Neutrophils Absolute: 3.2 10*3/uL (ref 1.4–7.0)
Neutrophils: 58 %
Platelets: 350 10*3/uL (ref 150–450)
RBC: 4.44 x10E6/uL (ref 3.77–5.28)
RDW: 12.7 % (ref 11.7–15.4)
WBC: 5.6 10*3/uL (ref 3.4–10.8)

## 2019-12-22 LAB — HEMOGLOBIN A1C
Est. average glucose Bld gHb Est-mCnc: 128 mg/dL
Hgb A1c MFr Bld: 6.1 % — ABNORMAL HIGH (ref 4.8–5.6)

## 2019-12-22 LAB — LIPID PANEL W/O CHOL/HDL RATIO
Cholesterol, Total: 193 mg/dL (ref 100–199)
HDL: 65 mg/dL (ref >39)
LDL Chol Calc (NIH): 116 mg/dL — ABNORMAL HIGH (ref 0–99)
Triglycerides: 65 mg/dL (ref 0–149)
VLDL Cholesterol Cal: 12 mg/dL (ref 5–40)

## 2019-12-22 LAB — TSH: TSH: 0.654 u[IU]/mL (ref 0.450–4.500)

## 2019-12-22 MED ORDER — KETOCONAZOLE 2 % EX CREA: 1.0000 "application " | TOPICAL_CREAM | Freq: Every day | CUTANEOUS | 0 refills | Status: DC

## 2019-12-27 NOTE — Assessment & Plan Note (Signed)
Diet and exercise reviewed 

## 2019-12-27 NOTE — Assessment & Plan Note (Signed)
Stable and well controlled, continue current regimen 

## 2019-12-27 NOTE — Assessment & Plan Note (Addendum)
Recheck lipids, adjust as needed. Continue current regimen. Statin intolerant

## 2019-12-27 NOTE — Assessment & Plan Note (Signed)
Stable without recent exacerbations. Continue current regimen 

## 2019-12-27 NOTE — Assessment & Plan Note (Signed)
Stable, continue current regimen. Wears CPAP

## 2019-12-27 NOTE — Assessment & Plan Note (Signed)
BPs stable and WNL, continue current regimen 

## 2020-01-28 ENCOUNTER — Other Ambulatory Visit: Payer: Self-pay | Admitting: Family Medicine

## 2020-01-30 ENCOUNTER — Encounter: Payer: Self-pay | Admitting: Family Medicine

## 2020-02-05 ENCOUNTER — Ambulatory Visit
Admission: RE | Admit: 2020-02-05 | Discharge: 2020-02-05 | Disposition: A | Discharge status: Home / Self Care | Payer: BC Managed Care – PPO | Source: Ambulatory Visit | Attending: Family Medicine | Admitting: Family Medicine

## 2020-02-05 DIAGNOSIS — Z1239 Encounter for other screening for malignant neoplasm of breast: Secondary | ICD-10-CM

## 2020-02-05 DIAGNOSIS — Z1231 Encounter for screening mammogram for malignant neoplasm of breast: Secondary | ICD-10-CM | POA: Insufficient documentation

## 2020-02-07 ENCOUNTER — Other Ambulatory Visit: Payer: Self-pay | Admitting: Family Medicine

## 2020-02-07 NOTE — Telephone Encounter (Signed)
Requested Prescriptions  Pending Prescriptions Disp Refills  . meloxicam (MOBIC) 15 MG tablet [Pharmacy Med Name: MELOXICAM 15MG  TABLETS] 30 tablet 1    Sig: TAKE 1/2 TO 1 TABLET BY MOUTH ONCE DAILY AS NEEDED     Analgesics:  COX2 Inhibitors Passed - 02/07/2020  3:40 AM      Passed - HGB in normal range and within 360 days    Hemoglobin  Date Value Ref Range Status  12/21/2019 13.6 11.1 - 15.9 g/dL Final         Passed - Cr in normal range and within 360 days    Creat  Date Value Ref Range Status  05/14/2016 0.62 0.50 - 1.05 mg/dL Final    Comment:      For patients > or = 54 years of age: The upper reference limit for Creatinine is approximately 13% higher for people identified as African-American.      Creatinine, Ser  Date Value Ref Range Status  12/21/2019 0.61 0.57 - 1.00 mg/dL Final         Passed - Patient is not pregnant      Passed - Valid encounter within last 12 months    Recent Outpatient Visits          1 month ago Left wrist pain   [facility] Merrie Roof [place], [place]   8 months ago Essential hypertension, benign   [place], [place], [place]   1 year ago Essential hypertension, benign   [facility] Merrie Roof [place], [place]   1 year ago Plantar fasciitis   [facility] Merrie Roof [place], [place]   1 year ago Elevated alkaline phosphatase level   [place], NP      Future Appointments            In 4 months Orene Desanctis, Lilia Argue, [place], [place]

## 2020-02-17 ENCOUNTER — Other Ambulatory Visit: Payer: Self-pay

## 2020-02-17 ENCOUNTER — Ambulatory Visit: Payer: BC Managed Care – PPO | Attending: Internal Medicine

## 2020-02-17 DIAGNOSIS — Z23 Encounter for immunization: Secondary | ICD-10-CM

## 2020-02-17 NOTE — Progress Notes (Signed)
   Covid-19 Vaccination Clinic  Name:  Marissa Baldwin    MRN: [id number] DOB: 1966/01/02  02/17/2020  Marissa Baldwin was observed post Covid-19 immunization for 15 minutes without incident. She was provided with Vaccine Information Sheet and instruction to access the V-Safe system.   Marissa Baldwin was instructed to call 911 with any severe reactions post vaccine: [name] Kitchen Difficulty breathing  . Swelling of face and throat  . A fast heartbeat  . A bad rash all over body  . Dizziness and weakness   Immunizations Administered    Name Date Dose VIS Date Route   Pfizer COVID-19 Vaccine 02/17/2020 10:51 AM 0.3 mL 12/13/2018 Intramuscular   Manufacturer: [place]   Lot: [id number]   [place]: [id number]

## 2020-02-21 DIAGNOSIS — G4733 Obstructive sleep apnea (adult) (pediatric): Secondary | ICD-10-CM | POA: Diagnosis not present

## 2020-03-04 ENCOUNTER — Encounter: Payer: Self-pay | Admitting: Family Medicine

## 2020-03-06 NOTE — Telephone Encounter (Signed)
Per your provider you would need a appointment for this,is there a day or time that works best?Do you prefer mornings or afternoons?

## 2020-03-12 ENCOUNTER — Ambulatory Visit: Payer: BC Managed Care – PPO | Attending: Internal Medicine

## 2020-03-12 DIAGNOSIS — Z23 Encounter for immunization: Secondary | ICD-10-CM

## 2020-03-12 NOTE — Progress Notes (Signed)
   Covid-19 Vaccination Clinic  Name:  Marissa Baldwin    MRN: [id number] DOB: 25-Mar-1966  03/12/2020  [name] was observed post Covid-19 immunization for 15 minutes without incident. She was provided with Vaccine Information Sheet and instruction to access the V-Safe system.   [name] was instructed to call 911 with any severe reactions post vaccine: [name] Kitchen Difficulty breathing  . Swelling of face and throat  . A fast heartbeat  . A bad rash all over body  . Dizziness and weakness   Immunizations Administered    Name Date Dose VIS Date Route   Pfizer COVID-19 Vaccine 03/12/2020  3:24 PM 0.3 mL 12/13/2018 Intramuscular   Manufacturer: [employer]   Lot: [id number]   [place]: [id number]

## 2020-03-17 ENCOUNTER — Encounter: Payer: Self-pay | Admitting: Family Medicine

## 2020-03-19 ENCOUNTER — Ambulatory Visit: Payer: BC Managed Care – PPO | Admitting: Family Medicine

## 2020-03-20 ENCOUNTER — Encounter: Payer: Self-pay | Admitting: Family Medicine

## 2020-03-20 ENCOUNTER — Other Ambulatory Visit: Payer: Self-pay

## 2020-03-20 ENCOUNTER — Ambulatory Visit (INDEPENDENT_AMBULATORY_CARE_PROVIDER_SITE_OTHER): Payer: BC Managed Care – PPO | Admitting: Family Medicine

## 2020-03-20 VITALS — BP 135/84 | HR 59 | Temp 98.5°F | Wt 162.0 lb

## 2020-03-20 DIAGNOSIS — M79642 Pain in left hand: Secondary | ICD-10-CM

## 2020-03-20 MED ORDER — TRIAMCINOLONE ACETONIDE 40 MG/ML IJ SUSP
40.0000 mg | Freq: Once | INTRAMUSCULAR | Status: AC
  Administered 2020-03-20: 40 mg via INTRAMUSCULAR

## 2020-03-20 NOTE — Progress Notes (Signed)
BP 135/84   Pulse (!) 59   Temp 98.5 F (36.9 C) (Oral)   Wt 162 lb (73.5 kg)   SpO2 98%   BMI 33.86 kg/m    Subjective:    Patient ID: Marissa Baldwin, female    DOB: 03/05/1966, 54 y.o.   MRN: [id number]  HPI: Marissa Baldwin is a 54 y.o. female  Chief Complaint  Patient presents with  . Hand Pain    left hand   Left hand pain down into lateral wrist returned about 4-5 days ago. Had been pain free for months prior to this flare. Restarted using her brace earlier this week and trying voltaren gel all without much relief. States she uses this wrist quite a lot at work [employer] and wonders if that's the issue. Denies significant swelling, redness, warmth, new injury, numbness and tingling. Has had good improvement with kenalog IM injection in the past for this.   Relevant past medical, surgical, family and social history reviewed and updated as indicated. Interim medical history since our last visit reviewed. Allergies and medications reviewed and updated.  Review of Systems  Per HPI unless specifically indicated above     Objective:    BP 135/84   Pulse (!) 59   Temp 98.5 F (36.9 C) (Oral)   Wt 162 lb (73.5 kg)   SpO2 98%   BMI 33.86 kg/m   Wt Readings from Last 3 Encounters:  03/20/20 162 lb (73.5 kg)  12/21/19 159 lb (72.1 kg)  05/16/19 163 lb (73.9 kg)    Physical Exam Vitals and nursing note reviewed.  Constitutional:      Appearance: Normal appearance. She is not ill-appearing.  HENT:     Head: Atraumatic.  Eyes:     Extraocular Movements: Extraocular movements intact.     Conjunctiva/sclera: Conjunctivae normal.  Cardiovascular:     Rate and Rhythm: Normal rate and regular rhythm.     Heart sounds: Normal heart sounds.  Pulmonary:     Effort: Pulmonary effort is normal.     Breath sounds: Normal breath sounds.  Musculoskeletal:        General: Normal range of motion.     Cervical back: Normal range of motion and neck supple.     Comments:  Decreased grip strength left hand ttp left lateral wrist  Skin:    General: Skin is warm and dry.     Findings: No erythema.  Neurological:     Mental Status: She is alert and oriented to person, place, and time.  Psychiatric:        Mood and Affect: Mood normal.        Thought Content: Thought content normal.        Judgment: Judgment normal.     Results for orders placed or performed in visit on 12/21/19  CBC with Differential/Platelet  Result Value Ref Range   WBC 5.6 3.4 - 10.8 x10E3/uL   RBC 4.44 3.77 - 5.28 x10E6/uL   Hemoglobin 13.6 11.1 - 15.9 g/dL   Hematocrit 40.5 34.0 - 46.6 %   MCV 91 79 - 97 fL   MCH 30.6 26.6 - 33.0 pg   MCHC 33.6 31.5 - 35.7 g/dL   RDW 12.7 11.7 - 15.4 %   Platelets 350 150 - 450 x10E3/uL   Neutrophils 58 Not Estab. %   Lymphs 34 Not Estab. %   Monocytes 5 Not Estab. %   Eos 2 Not Estab. %   Basos  1 Not Estab. %   Neutrophils Absolute 3.2 1.4 - 7.0 x10E3/uL   Lymphocytes Absolute 1.9 0.7 - 3.1 x10E3/uL   Monocytes Absolute 0.3 0.1 - 0.9 x10E3/uL   EOS (ABSOLUTE) 0.1 0.0 - 0.4 x10E3/uL   Basophils Absolute 0.1 0.0 - 0.2 x10E3/uL   Immature Granulocytes 0 Not Estab. %   Immature Grans (Abs) 0.0 0.0 - 0.1 x10E3/uL  Comprehensive metabolic panel  Result Value Ref Range   Glucose 103 (H) 65 - 99 mg/dL   BUN 22 6 - 24 mg/dL   Creatinine, Ser 0.61 0.57 - 1.00 mg/dL   GFR calc non Af Amer 104 >59 mL/min/1.73   GFR calc Af Amer 120 >59 mL/min/1.73   BUN/Creatinine Ratio 36 (H) 9 - 23   Sodium 138 134 - 144 mmol/L   Potassium 4.1 3.5 - 5.2 mmol/L   Chloride 102 96 - 106 mmol/L   CO2 22 20 - 29 mmol/L   Calcium 9.2 8.7 - 10.2 mg/dL   Total Protein 7.3 6.0 - 8.5 g/dL   Albumin 4.6 3.8 - 4.9 g/dL   Globulin, Total 2.7 1.5 - 4.5 g/dL   Albumin/Globulin Ratio 1.7 1.2 - 2.2   Bilirubin Total 0.5 0.0 - 1.2 mg/dL   Alkaline Phosphatase 105 39 - 117 IU/L   AST 23 0 - 40 IU/L   ALT 32 0 - 32 IU/L  Lipid Panel w/o Chol/HDL Ratio  Result Value Ref  Range   Cholesterol, Total 193 100 - 199 mg/dL   Triglycerides 65 0 - 149 mg/dL   HDL 65 >39 mg/dL   VLDL Cholesterol Cal 12 5 - 40 mg/dL   LDL Chol Calc (NIH) 116 (H) 0 - 99 mg/dL  TSH  Result Value Ref Range   TSH 0.654 0.450 - 4.500 uIU/mL  UA/M w/rflx Culture, Routine   Specimen: Urine   URINE  Result Value Ref Range   Specific Gravity, UA 1.020 1.005 - 1.030   pH, UA 8.5 (H) 5.0 - 7.5   Color, UA Yellow Yellow   Appearance Ur Clear Clear   Leukocytes,UA Negative Negative   Protein,UA Negative Negative/Trace   Glucose, UA Negative Negative   Ketones, UA Negative Negative   RBC, UA Negative Negative   Bilirubin, UA Negative Negative   Urobilinogen, Ur 0.2 0.2 - 1.0 mg/dL   Nitrite, UA Negative Negative  HgB A1c  Result Value Ref Range   Hgb A1c MFr Bld 6.1 (H) 4.8 - 5.6 %   Est. average glucose Bld gHb Est-mCnc 128 mg/dL      Assessment & Plan:   Problem List Items Addressed This Visit    None    Visit Diagnoses    Left hand pain    -  Primary   Suspect tendonitis/overuse. Discussed braces, stretches, massage, movement modification to use larger joints. IM Kenalog given today. F/u if not improving   Relevant Medications   triamcinolone acetonide (KENALOG-40) injection 40 mg       Follow up plan: Return if symptoms worsen or fail to improve.

## 2020-06-09 ENCOUNTER — Other Ambulatory Visit: Payer: Self-pay | Admitting: Family Medicine

## 2020-06-09 NOTE — Telephone Encounter (Signed)
Requested Prescriptions  Pending Prescriptions Disp Refills  . metFORMIN (GLUCOPHAGE-XR) 500 MG 24 hr tablet [Pharmacy Med Name: METFORMIN ER 500MG 24HR TABS] 90 tablet 0    Sig: TAKE 1 TABLET(500 MG) BY MOUTH DAILY WITH BREAKFAST     Endocrinology:  Diabetes - Biguanides Passed - 06/09/2020  5:51 PM      Passed - Cr in normal range and within 360 days    Creat  Date Value Ref Range Status  05/14/2016 0.62 0.50 - 1.05 mg/dL Final    Comment:      For patients > or = 54 years of age: The upper reference limit for Creatinine is approximately 13% higher for people identified as African-American.      Creatinine, Ser  Date Value Ref Range Status  12/21/2019 0.61 0.57 - 1.00 mg/dL Final         Passed - HBA1C is between 0 and 7.9 and within 180 days    HB A1C (BAYER DCA - WAIVED)  Date Value Ref Range Status  05/12/2017 5.9 <7.0 % Final    Comment:                                          Diabetic Adult            <7.0                                       Healthy Adult        4.3 - 5.7                                                           (DCCT/NGSP) American Diabetes Association's Summary of Glycemic Recommendations for Adults with Diabetes: Hemoglobin A1c <7.0%. More stringent glycemic goals (A1c <6.0%) may further reduce complications at the cost of increased risk of hypoglycemia.    Hgb A1c MFr Bld  Date Value Ref Range Status  12/21/2019 6.1 (H) 4.8 - 5.6 % Final    Comment:             Prediabetes: 5.7 - 6.4          Diabetes: >6.4          Glycemic control for adults with diabetes: <7.0          Passed - eGFR in normal range and within 360 days    GFR calc Af Amer  Date Value Ref Range Status  12/21/2019 120 >59 mL/min/1.73 Final   GFR calc non Af Amer  Date Value Ref Range Status  12/21/2019 104 >59 mL/min/1.73 Final         Passed - Valid encounter within last 6 months    Recent Outpatient Visits          2 months ago Left hand pain   [place], [place], [place]   5 months ago Left wrist pain   [place], [place], [place]   1 year ago Essential hypertension, benign   [place], [place], [place]   1 year ago Essential hypertension, benign  [place], [place], [place]   1 year ago Plantar fasciitis   [facility] [place], [place]      Future Appointments            In 2 weeks Cannady, Barbaraann Faster, NP [facility]

## 2020-06-14 ENCOUNTER — Other Ambulatory Visit: Payer: Self-pay | Admitting: Family Medicine

## 2020-06-21 ENCOUNTER — Encounter: Payer: Self-pay | Admitting: Nurse Practitioner

## 2020-06-26 ENCOUNTER — Ambulatory Visit (INDEPENDENT_AMBULATORY_CARE_PROVIDER_SITE_OTHER): Payer: BC Managed Care – PPO | Admitting: Nurse Practitioner

## 2020-06-26 ENCOUNTER — Other Ambulatory Visit: Payer: Self-pay

## 2020-06-26 ENCOUNTER — Ambulatory Visit: Payer: BC Managed Care – PPO | Admitting: Family Medicine

## 2020-06-26 ENCOUNTER — Other Ambulatory Visit (HOSPITAL_COMMUNITY)
Admission: RE | Admit: 2020-06-26 | Discharge: 2020-06-26 | Disposition: A | Discharge status: Home / Self Care | Payer: BC Managed Care – PPO | Source: Ambulatory Visit | Attending: Nurse Practitioner | Admitting: Nurse Practitioner

## 2020-06-26 ENCOUNTER — Encounter: Payer: Self-pay | Admitting: Nurse Practitioner

## 2020-06-26 VITALS — BP 145/86 | HR 65 | Temp 98.3°F | Wt 163.0 lb

## 2020-06-26 DIAGNOSIS — E785 Hyperlipidemia, unspecified: Secondary | ICD-10-CM

## 2020-06-26 DIAGNOSIS — I10 Essential (primary) hypertension: Secondary | ICD-10-CM

## 2020-06-26 DIAGNOSIS — I152 Hypertension secondary to endocrine disorders: Secondary | ICD-10-CM

## 2020-06-26 DIAGNOSIS — E669 Obesity, unspecified: Secondary | ICD-10-CM | POA: Diagnosis not present

## 2020-06-26 DIAGNOSIS — E1169 Type 2 diabetes mellitus with other specified complication: Secondary | ICD-10-CM | POA: Diagnosis not present

## 2020-06-26 DIAGNOSIS — F324 Major depressive disorder, single episode, in partial remission: Secondary | ICD-10-CM

## 2020-06-26 DIAGNOSIS — E1159 Type 2 diabetes mellitus with other circulatory complications: Secondary | ICD-10-CM

## 2020-06-26 DIAGNOSIS — G4733 Obstructive sleep apnea (adult) (pediatric): Secondary | ICD-10-CM | POA: Diagnosis not present

## 2020-06-26 DIAGNOSIS — E538 Deficiency of other specified B group vitamins: Secondary | ICD-10-CM | POA: Diagnosis not present

## 2020-06-26 DIAGNOSIS — F5101 Primary insomnia: Secondary | ICD-10-CM

## 2020-06-26 DIAGNOSIS — Z124 Encounter for screening for malignant neoplasm of cervix: Secondary | ICD-10-CM | POA: Diagnosis not present

## 2020-06-26 LAB — MICROALBUMIN, URINE WAIVED
Creatinine, Urine Waived: 100 mg/dL (ref 10–300)
Microalb, Ur Waived: 30 mg/L — ABNORMAL HIGH (ref 0–19)
Microalb/Creat Ratio: <30 mg/g (ref <30)

## 2020-06-26 LAB — BAYER DCA HB A1C WAIVED: HB A1C (BAYER DCA - WAIVED): 6.5 % (ref <7.0)

## 2020-06-26 MED ORDER — VENLAFAXINE HCL ER 150 MG PO CP24
150.0000 mg | ORAL_CAPSULE | Freq: Every day | ORAL | 1 refills | Status: DC
Start: 2020-06-26 — End: 2021-01-27

## 2020-06-26 MED ORDER — VENLAFAXINE HCL ER 75 MG PO CP24: 75.0000 mg | ORAL_CAPSULE | Freq: Every day | ORAL | 4 refills | Status: DC

## 2020-06-26 NOTE — Assessment & Plan Note (Addendum)
Chronic, ongoing.  Continue current medication regimen and adjust as needed -- Zetia is statin intolerant.  Lipid panel today.

## 2020-06-26 NOTE — Assessment & Plan Note (Signed)
Recommend consistent use of CPAP at home.

## 2020-06-26 NOTE — Assessment & Plan Note (Signed)
Chronic, stable A1C.  Today A1C 6.5%, praised for continued good control.  Continue current medication regimen and adjust as needed.  Recommend she monitor BS at home BID and document for visits.  Focus on diabetic diet.  Urine ALB 30 today, continue Losartan for kidney protection.  Return in 6 months.

## 2020-06-26 NOTE — Assessment & Plan Note (Signed)
Chronic, stable with Seroquel.  Continue this regimen and adjust as needed.

## 2020-06-26 NOTE — Assessment & Plan Note (Signed)
Chronic, stable.  Denies SI/HI.  Will trial increase on Effexor to 225 MG, which may better benefit mood.  Monitor BP closely with this and change to alternate regimen as needed. Continue Seroquel at current dose.  Plan on follow-up in 6 months.

## 2020-06-26 NOTE — Assessment & Plan Note (Signed)
Recommended eating smaller high protein, low fat meals more frequently and exercising 30 mins a day 5 times a week with a goal of 10-15lb weight loss in the next 3 months. Patient voiced their understanding and motivation to adhere to these recommendations.  

## 2020-06-26 NOTE — Progress Notes (Signed)
BP (!) 145/86    Pulse 65    Temp 98.3 F (36.8 C) (Oral)    Wt 163 lb (73.9 kg)    SpO2 98%    BMI 34.07 kg/m    Subjective:    Patient ID: Marissa Baldwin, female    DOB: [birth year], 54 y.o.   MRN: [id number]  HPI: Marissa Baldwin is a 54 y.o. female  Chief Complaint  Patient presents with   Diabetes   Hypertension   Hyperlipidemia   Depression   Medication Refill    ketoconazole cream   Pap for cervical cancer screening requested today, she reports not having one in 10 years and has an intact uterus/cervix.    DIABETES Continues on Metformin.  Last A1C was 6.1%. Hypoglycemic episodes:no Polydipsia/polyuria: no Visual disturbance: no Chest pain: no Paresthesias: no Glucose Monitoring: yes  Accucheck frequency: Daily  Fasting glucose: <130 often  Post prandial:  Evening: 190 last night  Before meals: Taking Insulin?: no  Long acting insulin:  Short acting insulin: Blood Pressure Monitoring: rarely Retinal Examination: Up to Date Foot Exam: Up to Date Pneumovax: Up to Date Influenza: refuses Aspirin: no   HYPERTENSION / HYPERLIPIDEMIA Continues on Losartan 50 MG daily, Zetia, and Amlodipine.  Uses CPAP about 1/2 the night. Satisfied with current treatment? yes Duration of hypertension: chronic BP monitoring frequency: rarely BP range:  BP medication side effects: no Duration of hyperlipidemia: chronic Cholesterol medication side effects: no Cholesterol supplements: none Medication compliance: good compliance Aspirin: no Recent stressors: no Recurrent headaches: no Visual changes: no Palpitations: no Dyspnea: no Chest pain: no Lower extremity edema: no Dizzy/lightheaded: no   DEPRESSION Continues on Seroquel 25 MG daily (takes 1/2 of this) and Effexor XR 150 MG daily. Mood status: controlled Satisfied with current treatment?: yes Symptom severity: moderate  Duration of current treatment : chronic Side effects: no Medication compliance: good  compliance Psychotherapy/counseling: none Depressed mood: yes Anxious mood: yes Anhedonia: no Significant weight loss or gain: no Insomnia: yes hard to fall asleep Fatigue: no Feelings of worthlessness or guilt: no Impaired concentration/indecisiveness: no Suicidal ideations: no Hopelessness: no Crying spells: no Depression screen [facility] 2/9 06/26/2020 03/20/2020 12/21/2019 05/16/2019 11/14/2018  Decreased Interest 1 0 0 0 0  Down, Depressed, Hopeless 1 2 0 1 1  PHQ - 2 Score 2 2 0 1 1  Altered sleeping 1 1 1 3 3   Tired, decreased energy 3 3 2  0 3  Change in appetite 3 2 1  0 0  Feeling bad or failure about yourself  3 0 0 1 0  Trouble concentrating 2 2 1 3  0  Moving slowly or fidgety/restless 1 1 0 1 0  Suicidal thoughts 0 0 0 0 0  PHQ-9 Score 15 11 5 9 7   Difficult doing work/chores - Somewhat difficult - Not difficult at all Not difficult at all  Some recent data might be hidden    Relevant past medical, surgical, family and social history reviewed and updated as indicated. Interim medical history since our last visit reviewed. Allergies and medications reviewed and updated.  Review of Systems  Constitutional: Negative for activity change, appetite change, diaphoresis, fatigue and fever.  Respiratory: Negative for cough, chest tightness and shortness of breath.   Cardiovascular: Negative for chest pain, palpitations and leg swelling.  Gastrointestinal: Negative.   Endocrine: Negative for polydipsia, polyphagia and polyuria.  Neurological: Negative.   Psychiatric/Behavioral: Positive for decreased concentration and sleep disturbance. Negative for self-injury and suicidal ideas.  The patient is nervous/anxious.     Per HPI unless specifically indicated above     Objective:    BP (!) 145/86    Pulse 65    Temp 98.3 F (36.8 C) (Oral)    Wt 163 lb (73.9 kg)    SpO2 98%    BMI 34.07 kg/m   Wt Readings from Last 3 Encounters:  06/26/20 163 lb (73.9 kg)  03/20/20 162 lb (73.5 kg)   12/21/19 159 lb (72.1 kg)    Physical Exam Vitals and nursing note reviewed.  Constitutional:      General: She is awake. She is not in acute distress.    Appearance: She is well-developed and well-groomed. She is obese. She is not ill-appearing.  HENT:     Head: Normocephalic.     Right Ear: Hearing normal.     Left Ear: Hearing normal.     Nose: Nose normal.  Eyes:     General: Lids are normal.        Right eye: No discharge.        Left eye: No discharge.     Conjunctiva/sclera: Conjunctivae normal.     Pupils: Pupils are equal, round, and reactive to light.  Neck:     Thyroid: No thyromegaly.     Vascular: No carotid bruit.  Cardiovascular:     Rate and Rhythm: Normal rate and regular rhythm.     Heart sounds: Normal heart sounds. No murmur heard.  No gallop.   Pulmonary:     Effort: Pulmonary effort is normal. No accessory muscle usage or respiratory distress.     Breath sounds: Normal breath sounds.  Abdominal:     General: Bowel sounds are normal.     Palpations: Abdomen is soft. There is no hepatomegaly or splenomegaly.  Genitourinary:    Exam position: Lithotomy position.     Labia:        Right: No rash.        Left: No rash.      Vagina: Normal.     Cervix: Normal.     Uterus: Normal.      Adnexa: Right adnexa normal and left adnexa normal.     Rectum: Normal.     Comments: Declined chaperone.  Cervix anterior, viewed and pap sample obtained. Musculoskeletal:     Cervical back: Normal range of motion and neck supple.     Right lower leg: No edema.     Left lower leg: No edema.  Skin:    General: Skin is warm and dry.  Neurological:     Mental Status: She is alert and oriented to person, place, and time.  Psychiatric:        Attention and Perception: Attention normal.        Mood and Affect: Mood normal.        Speech: Speech normal.        Behavior: Behavior is agitated.        Thought Content: Thought content normal.     Results for orders  placed or performed in visit on 06/26/20  Bayer DCA Hb A1c Waived  Result Value Ref Range   HB A1C (BAYER DCA - WAIVED) 6.5 <7.0 %  Microalbumin, Urine Waived  Result Value Ref Range   Microalb, Ur Waived 30 (H) 0 - 19 mg/L   Creatinine, Urine Waived 100 10 - 300 mg/dL   Microalb/Creat Ratio <30 <30 mg/g      Assessment & Plan:  Problem List Items Addressed This Visit      Cardiovascular and Mediastinum   Hypertension associated with diabetes ([place])    Chronic, ongoing.  BP elevated today due to agitation, will plan on recheck next visit.  Continue current medication regimen and adjust as needed.  Recommend she check BP at least 3 days a week at home and document for provider + focus on DASH diet. Losartan for kidney protection with diabetes.  BMP today.  Return in 6 months.       Relevant Orders   Bayer DCA Hb A1c Waived (Completed)   Basic metabolic panel   Microalbumin, Urine Waived (Completed)     Respiratory   Sleep apnea    Recommend consistent use of CPAP at home.        Endocrine   Hyperlipidemia associated with type 2 diabetes mellitus (HCC)    Chronic, ongoing.  Continue current medication regimen and adjust as needed -- Zetia is statin intolerant.  Lipid panel today.         Relevant Orders   Bayer DCA Hb A1c Waived (Completed)   Lipid Panel w/o Chol/HDL Ratio   Type 2 diabetes mellitus with obesity (HCC) - Primary    Chronic, stable A1C.  Today A1C 6.5%, praised for continued good control.  Continue current medication regimen and adjust as needed.  Recommend she monitor BS at home BID and document for visits.  Focus on diabetic diet.  Urine ALB 30 today, continue Losartan for kidney protection.  Return in 6 months.      Relevant Orders   Bayer DCA Hb A1c Waived (Completed)   Microalbumin, Urine Waived (Completed)     Other   Insomnia    Chronic, stable with Seroquel.  Continue this regimen and adjust as needed.      Obesity (BMI 30-39.9)     Recommended eating smaller high protein, low fat meals more frequently and exercising 30 mins a day 5 times a week with a goal of 10-15lb weight loss in the next 3 months. Patient voiced their understanding and motivation to adhere to these recommendations.       Depression, major, single episode, in partial remission (HCC)    Chronic, stable.  Denies SI/HI.  Will trial increase on Effexor to 225 MG, which may better benefit mood.  Monitor BP closely with this and change to alternate regimen as needed. Continue Seroquel at current dose.  Plan on follow-up in 6 months.        Relevant Medications   venlafaxine XR (EFFEXOR-XR) 150 MG 24 hr capsule   venlafaxine XR (EFFEXOR-XR) 75 MG 24 hr capsule    Other Visit Diagnoses    B12 deficiency       History of low and on chronic Metformin, check B12 level today and start supplement as needed.   Relevant Orders   Vitamin B12   Cervical cancer screening       Pap obtained and sent   Relevant Orders   Cytology - PAP       Follow up plan: Return in about 6 months (around 12/24/2020) for T2DM, HTN/HLD, MOOD.

## 2020-06-26 NOTE — Assessment & Plan Note (Signed)
Chronic, ongoing.  BP elevated today due to agitation, will plan on recheck next visit.  Continue current medication regimen and adjust as needed.  Recommend she check BP at least 3 days a week at home and document for provider + focus on DASH diet. Losartan for kidney protection with diabetes.  BMP today.  Return in 6 months.

## 2020-06-26 NOTE — Patient Instructions (Signed)

## 2020-06-27 LAB — VITAMIN B12: Vitamin B-12: 558 pg/mL (ref 232–1245)

## 2020-06-27 LAB — BASIC METABOLIC PANEL
BUN/Creatinine Ratio: 27 — ABNORMAL HIGH (ref 9–23)
BUN: 15 mg/dL (ref 6–24)
CO2: 24 mmol/L (ref 20–29)
Calcium: 9.7 mg/dL (ref 8.7–10.2)
Chloride: 101 mmol/L (ref 96–106)
Creatinine, Ser: 0.56 mg/dL — ABNORMAL LOW (ref 0.57–1.00)
GFR calc Af Amer: 122 mL/min/{1.73_m2} (ref >59)
GFR calc non Af Amer: 106 mL/min/{1.73_m2} (ref >59)
Glucose: 137 mg/dL — ABNORMAL HIGH (ref 65–99)
Potassium: 4.7 mmol/L (ref 3.5–5.2)
Sodium: 139 mmol/L (ref 134–144)

## 2020-06-27 LAB — LIPID PANEL W/O CHOL/HDL RATIO
Cholesterol, Total: 226 mg/dL — ABNORMAL HIGH (ref 100–199)
HDL: 65 mg/dL (ref >39)
LDL Chol Calc (NIH): 143 mg/dL — ABNORMAL HIGH (ref 0–99)
Triglycerides: 101 mg/dL (ref 0–149)
VLDL Cholesterol Cal: 18 mg/dL (ref 5–40)

## 2020-06-27 NOTE — Progress Notes (Signed)
Contacted via MyChart  Good afternoon Marissa Baldwin, your labs have returned.  Kidney function, electrolytes, and B12 level all look good.  Cholesterol levels remain elevated, are you taking Zetia daily?  Have you taken a statin before?  Your LDL is 143 and for stroke prevention would like to see this <70.  If you have not tried statin before it would be good to add on a low dose of one to start.  Please let me know if any questions. Keep being awesome!!  Thank you for allowing me to participate in your care. Kindest regards, Alisia Vanengen

## 2020-07-01 LAB — CYTOLOGY - PAP
Comment: NEGATIVE
Diagnosis: NEGATIVE
High risk HPV: NEGATIVE

## 2020-07-01 NOTE — Progress Notes (Signed)
Contacted via MyChart  Good afternoon Ayrianna, I have good news.  Your pap returned showing negative results.  We can repeat in 5 years.  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  If this causes issues with sexual intercourse, such as discomfort, let me know.  Have a great day!! Keep being awesome!!  Thank you for allowing me to participate in your care. Kindest regards, Min Collymore

## 2020-07-30 ENCOUNTER — Other Ambulatory Visit: Payer: Self-pay | Admitting: Family Medicine

## 2020-07-30 NOTE — Telephone Encounter (Signed)
Requested Prescriptions  Pending Prescriptions Disp Refills   ezetimibe (ZETIA) 10 MG tablet [Pharmacy Med Name: EZETIMIBE 10MG  TABLETS] 90 tablet 1    Sig: TAKE 1 TABLET(10 MG) BY MOUTH DAILY     Cardiovascular:  Antilipid - Sterol Transport Inhibitors Failed - 07/30/2020  3:36 AM      Failed - Total Cholesterol in normal range and within 360 days    Cholesterol, Total  Date Value Ref Range Status  06/26/2020 226 (H) 100 - 199 mg/dL Final         Failed - LDL in normal range and within 360 days    LDL Chol Calc (NIH)  Date Value Ref Range Status  06/26/2020 143 (H) 0 - 99 mg/dL Final         Passed - HDL in normal range and within 360 days    HDL  Date Value Ref Range Status  06/26/2020 65 >39 mg/dL Final         Passed - Triglycerides in normal range and within 360 days    Triglycerides  Date Value Ref Range Status  06/26/2020 101 0 - 149 mg/dL Final         Passed - Valid encounter within last 12 months    Recent Outpatient Visits          1 month ago Type 2 diabetes mellitus with obesity ([place])   [place], [place] T, NP   4 months ago Left hand pain   [place], [place], [place]   7 months ago Left wrist pain   [place], Lilia [place], [place]   1 year ago Essential hypertension, benign   [place], [place], [place]   1 year ago Essential hypertension, benign   [place], [place], [place]      Future Appointments            In 4 months Cannady, Henrine Scre[place] T, NP [facility]            amLODipine (Alpha) 5 MG tablet [employer] Med Name: AMLODIPINE BESYLATE 5MG  TABLETS] 90 tablet 1    Sig: TAKE 1 TABLET(5 MG) BY MOUTH DAILY     Cardiovascular:  Calcium Channel Blockers Failed - 07/30/2020  3:36 AM      Failed - Last BP in normal range    BP Readings from Last 1 Encounters:  06/26/20 (!) 145/86         Passed - Valid  encounter within last 6 months    Recent Outpatient Visits          1 month ago Type 2 diabetes mellitus with obesity ([place])   [place], [place] T, NP   4 months ago Left hand pain   [place], [place], [place]   7 months ago Left wrist pain   [place], [place], [place]   1 year ago Essential hypertension, benign   [place], [place], [place]   1 year ago Essential hypertension, benign   [place], Lilia [place], [place]      Future Appointments            In 4 months Cannady, Barbaraann Faster, NP [facility]            losartan (COZAAR) 50 MG tablet [Pharmacy Med Name: LOSARTAN 50MG  TABLETS] 90 tablet 1    Sig: TAKE 1  TABLET(50 MG) BY MOUTH DAILY     Cardiovascular:  Angiotensin Receptor Blockers Failed - 07/30/2020  3:36 AM      Failed - Cr in normal range and within 180 days    Creat  Date Value Ref Range Status  05/14/2016 0.62 0.50 - 1.05 mg/dL Final    Comment:      For patients > or = 54 years of age: The upper reference limit for Creatinine is approximately 13% higher for people identified as African-American.      Creatinine, Ser  Date Value Ref Range Status  06/26/2020 0.56 (L) 0.57 - 1.00 mg/dL Final         Failed - Last BP in normal range    BP Readings from Last 1 Encounters:  06/26/20 (!) 145/86         Passed - K in normal range and within 180 days    Potassium  Date Value Ref Range Status  06/26/2020 4.7 3.5 - 5.2 mmol/L Final         Passed - Patient is not pregnant      Passed - Valid encounter within last 6 months    Recent Outpatient Visits          1 month ago Type 2 diabetes mellitus with obesity ([place])   [place], [place] T, NP   4 months ago Left hand pain   [place], [place], [place]   7 months ago Left wrist pain   [facility] Mer[place] Roof [place], [place]   1 year ago Essential hypertension, benign   [place], Lilia [place], [place]   1 year ago Essential hypertension, benign   [place], L[place], [place]      Future Appointments            In 4 months Cannady, Barbaraann Faster, NP [facility]

## 2020-08-11 ENCOUNTER — Other Ambulatory Visit: Payer: Self-pay | Admitting: Family Medicine

## 2020-08-15 ENCOUNTER — Emergency Department
Admission: EM | Admit: 2020-08-15 | Discharge: 2020-08-15 | Disposition: A | Discharge status: Home / Self Care | Payer: BC Managed Care – PPO | Attending: Emergency Medicine | Admitting: Emergency Medicine

## 2020-08-15 ENCOUNTER — Other Ambulatory Visit: Payer: Self-pay

## 2020-08-15 ENCOUNTER — Emergency Department: Payer: BC Managed Care – PPO

## 2020-08-15 DIAGNOSIS — I10 Essential (primary) hypertension: Secondary | ICD-10-CM | POA: Diagnosis not present

## 2020-08-15 DIAGNOSIS — J45909 Unspecified asthma, uncomplicated: Secondary | ICD-10-CM | POA: Insufficient documentation

## 2020-08-15 DIAGNOSIS — M549 Dorsalgia, unspecified: Secondary | ICD-10-CM | POA: Diagnosis not present

## 2020-08-15 DIAGNOSIS — R3915 Urgency of urination: Secondary | ICD-10-CM | POA: Diagnosis not present

## 2020-08-15 DIAGNOSIS — E785 Hyperlipidemia, unspecified: Secondary | ICD-10-CM | POA: Insufficient documentation

## 2020-08-15 DIAGNOSIS — E1169 Type 2 diabetes mellitus with other specified complication: Secondary | ICD-10-CM | POA: Diagnosis not present

## 2020-08-15 DIAGNOSIS — Z7984 Long term (current) use of oral hypoglycemic drugs: Secondary | ICD-10-CM | POA: Insufficient documentation

## 2020-08-15 DIAGNOSIS — N201 Calculus of ureter: Secondary | ICD-10-CM

## 2020-08-15 DIAGNOSIS — K760 Fatty (change of) liver, not elsewhere classified: Secondary | ICD-10-CM | POA: Diagnosis not present

## 2020-08-15 DIAGNOSIS — Z79899 Other long term (current) drug therapy: Secondary | ICD-10-CM | POA: Insufficient documentation

## 2020-08-15 DIAGNOSIS — N1330 Unspecified hydronephrosis: Secondary | ICD-10-CM | POA: Diagnosis not present

## 2020-08-15 DIAGNOSIS — R109 Unspecified abdominal pain: Secondary | ICD-10-CM | POA: Diagnosis not present

## 2020-08-15 DIAGNOSIS — I700 Atherosclerosis of aorta: Secondary | ICD-10-CM | POA: Diagnosis not present

## 2020-08-15 DIAGNOSIS — N200 Calculus of kidney: Secondary | ICD-10-CM | POA: Diagnosis not present

## 2020-08-15 LAB — CBC
HCT: 40.6 % (ref 36.0–46.0)
Hemoglobin: 13.8 g/dL (ref 12.0–15.0)
MCH: 30.3 pg (ref 26.0–34.0)
MCHC: 34 g/dL (ref 30.0–36.0)
MCV: 89.2 fL (ref 80.0–100.0)
Platelets: 340 10*3/uL (ref 150–400)
RBC: 4.55 MIL/uL (ref 3.87–5.11)
RDW: 13 % (ref 11.5–15.5)
WBC: 6.3 10*3/uL (ref 4.0–10.5)
nRBC: 0 % (ref 0.0–0.2)

## 2020-08-15 LAB — URINALYSIS, COMPLETE (UACMP) WITH MICROSCOPIC
Bacteria, UA: NONE SEEN
Bilirubin Urine: NEGATIVE
Glucose, UA: >=500 mg/dL — AB
Hgb urine dipstick: NEGATIVE
Ketones, ur: 5 mg/dL — AB
Leukocytes,Ua: NEGATIVE
Nitrite: NEGATIVE
Protein, ur: NEGATIVE mg/dL
Specific Gravity, Urine: 1.016 (ref 1.005–1.030)
Squamous Epithelial / HPF: NONE SEEN (ref 0–5)
pH: 6 (ref 5.0–8.0)

## 2020-08-15 LAB — COMPREHENSIVE METABOLIC PANEL
ALT: 58 U/L — ABNORMAL HIGH (ref 0–44)
AST: 44 U/L — ABNORMAL HIGH (ref 15–41)
Albumin: 4.5 g/dL (ref 3.5–5.0)
Alkaline Phosphatase: 112 U/L (ref 38–126)
Anion gap: 10 (ref 5–15)
BUN: 19 mg/dL (ref 6–20)
CO2: 23 mmol/L (ref 22–32)
Calcium: 9.1 mg/dL (ref 8.9–10.3)
Chloride: 105 mmol/L (ref 98–111)
Creatinine, Ser: 0.7 mg/dL (ref 0.44–1.00)
GFR, Estimated: >60 mL/min (ref >60)
Glucose, Bld: 240 mg/dL — ABNORMAL HIGH (ref 70–99)
Potassium: 3.5 mmol/L (ref 3.5–5.1)
Sodium: 138 mmol/L (ref 135–145)
Total Bilirubin: 0.7 mg/dL (ref 0.3–1.2)
Total Protein: 7.9 g/dL (ref 6.5–8.1)

## 2020-08-15 LAB — LIPASE, BLOOD: Lipase: 38 U/L (ref 11–51)

## 2020-08-15 MED ORDER — KETOROLAC TROMETHAMINE 60 MG/2ML IM SOLN
15.0000 mg | Freq: Once | INTRAMUSCULAR | Status: AC
  Administered 2020-08-15: 15 mg via INTRAMUSCULAR
  Filled 2020-08-15: qty 2

## 2020-08-15 MED ORDER — ONDANSETRON 4 MG PO TBDP: 4.0000 mg | ORAL_TABLET | Freq: Three times a day (TID) | ORAL | 0 refills | Status: AC | PRN

## 2020-08-15 MED ORDER — KETOROLAC TROMETHAMINE 10 MG PO TABS: 10.0000 mg | ORAL_TABLET | Freq: Four times a day (QID) | ORAL | 0 refills | Status: DC | PRN

## 2020-08-15 NOTE — ED Provider Notes (Signed)
[facility] Emergency Department Provider Note  ____________________________________________  Time seen: Approximately 10:26 AM  I have reviewed the triage vital signs and the nursing notes.   HISTORY  Chief Complaint Abdominal Pain    HPI Marissa Baldwin is a 54 y.o. female with a history of hypertension who was in her usual state of health until about 4:00 AM when she woke up with right abdominal pain that is constant, severe, sharp, no aggravating or alleviating factors.  Now radiates to the back as well and associated with a constant urge to urinate.  Denies fevers chills, chest pain or shortness of breath.  No vomiting or diarrhea     Past Medical History:  Diagnosis Date  . Anxiety   . Arthritis 11/19   Hands hip shoulders  . BPPV (benign paroxysmal positional vertigo)   . Depression 04/2018  . Golfers elbow of left upper extremity   . Hyperlipidemia   . Hypertension   . Macular degeneration   . Morton's neuroma of right foot   . Postmenopausal symptoms   . Sleep apnea    CPAP     Patient Active Problem List   Diagnosis Date Noted  . Depression, major, single episode, in partial remission ([place]) 05/13/2018  . Statin intolerance 05/13/2018  . Plantar fasciitis 05/13/2018  . Chronic rhinosinusitis 11/12/2017  . Asthma 11/12/2017  . Obesity (BMI 30-39.9) 11/12/2017  . Insomnia 05/12/2017  . Type 2 diabetes mellitus with obesity ([place]) 05/12/2017  . Abnormal liver function test 11/16/2016  . Sleep apnea 05/14/2016  . Elevated alkaline phosphatase level 11/16/2015  . Hypertension associated with diabetes ([place]) 11/13/2015  . Hyperlipidemia associated with type 2 diabetes mellitus ([place]) 11/13/2015     Past Surgical History:  Procedure Laterality Date  . COLONOSCOPY WITH PROPOFOL N/A 07/20/2016   Procedure: COLONOSCOPY WITH PROPOFOL;  Surgeon: Lucilla Lame, MD;  Location: [place];  Service: Endoscopy;  Laterality: N/A;  sleep  apnea  . ESOPHAGOGASTRODUODENOSCOPY    . POLYPECTOMY  07/20/2016   Procedure: POLYPECTOMY;  Surgeon: Lucilla Lame, MD;  Location: [place];  Service: Endoscopy;;     Prior to Admission medications   Medication Sig Start Date End Date Taking? Authorizing Provider  acetaminophen (TYLENOL) 500 MG tablet Take 500 mg by mouth every 6 (six) hours as needed.    [provider]  AMBULATORY NON FORMULARY MEDICATION Medication Name: Please dispense cpap mask of choice and new supplies. DX G47.33 06/24/17   Laverle Hobby, MD  amLODipine (NORVASC) 5 MG tablet TAKE 1 TABLET(5 MG) BY MOUTH DAILY 07/30/20   Cannady, Henrine Screws T, NP  diclofenac sodium (VOLTAREN) 1 % GEL Apply 4 g topically 4 (four) times daily. 08/29/18   Hyatt, Max T, DPM  ezetimibe (ZETIA) 10 MG tablet TAKE 1 TABLET(10 MG) BY MOUTH DAILY 07/30/20   Cannady, Henrine Screws T, NP  ketoconazole (NIZORAL) 2 % cream Apply 1 application topically daily. Patient not taking: Reported on 06/26/2020 12/22/19   Volney American, PA-C  ketorolac (TORADOL) 10 MG tablet Take 1 tablet (10 mg total) by mouth every 6 (six) hours as needed for moderate pain. 08/15/20   Carrie Mew, MD  losartan (COZAAR) 50 MG tablet TAKE 1 TABLET(50 MG) BY MOUTH DAILY 07/30/20   Marnee Guarneri T, NP  meloxicam (MOBIC) 15 MG tablet TAKE 1/2 TO 1 TABLET BY MOUTH ONCE DAILY AS NEEDED 02/07/20   Volney American, PA-C  metFORMIN (GLUCOPHAGE-XR) 500 MG 24 hr tablet TAKE 1 TABLET(500  MG) BY MOUTH DAILY WITH BREAKFAST 06/09/20   Volney American, PA-C  ondansetron (ZOFRAN ODT) 4 MG disintegrating tablet Take 1 tablet (4 mg total) by mouth every 8 (eight) hours as needed for nausea or vomiting. 08/15/20   Carrie Mew, MD  PROAIR HFA 108 [phone] Base) MCG/ACT inhaler Inhale 2 puffs into the lungs every 6 (six) hours as needed for wheezing or shortness of breath. 05/16/19   Volney American, PA-C  QUEtiapine (SEROQUEL) 25 MG tablet Take 1 tablet  (25 mg total) by mouth at bedtime. 12/21/19   Volney American, PA-C  venlafaxine XR (EFFEXOR-XR) 150 MG 24 hr capsule Take 1 capsule (150 mg total) by mouth daily with breakfast. 06/26/20   Cannady, Henrine Screws T, NP  venlafaxine XR (EFFEXOR-XR) 75 MG 24 hr capsule Take 1 capsule (75 mg total) by mouth daily with breakfast. Take with your 150 MG tablet to = 225 MG daily. 06/26/20   Marnee Guarneri T, NP     Allergies Meloxicam, Statins, and Sulfa antibiotics   Family History  Problem Relation Age of Onset  . Diabetes Father   . Hypertension Father   . Heart disease Father   . Stroke Father        mini strokes  . Neuropathy Father   . Depression Father   . Cancer Maternal Aunt        rectal  . Varicose Veins Maternal Grandmother   . Diabetes Maternal Grandfather   . Heart disease Paternal Grandfather   . COPD Neg Hx     Social History Social History   Tobacco Use  . Smoking status: Never Smoker  . Smokeless tobacco: Never Used  Vaping Use  . Vaping Use: Never used  Substance Use Topics  . Alcohol use: Yes    Comment: occasional  . Drug use: No    Review of Systems  Constitutional:   No fever or chills.  ENT:   No sore throat. No rhinorrhea. Cardiovascular:   No chest pain or syncope. Respiratory:   No dyspnea or cough. Gastrointestinal:   Positive as above for right-sided abdominal pain without vomiting and diarrhea.  Musculoskeletal:   Negative for focal pain or swelling All other systems reviewed and are negative except as documented above in ROS and HPI.  ____________________________________________   PHYSICAL EXAM:  VITAL SIGNS: ED Triage Vitals  Enc Vitals Group     BP 08/15/20 0550 (!) 155/105     Pulse Rate 08/15/20 0550 71     Resp 08/15/20 0550 18     Temp 08/15/20 0550 98.5 F (36.9 C)     Temp Source 08/15/20 0550 Oral     SpO2 08/15/20 0550 96 %     Weight 08/15/20 0551 162 lb (73.5 kg)     Height 08/15/20 0551 4\' 11"  (1.499 m)     Head  Circumference --      Peak Flow --      Pain Score 08/15/20 0551 10     Pain Loc --      Pain Edu? --      Excl. in [place]? --     Vital signs reviewed, nursing assessments reviewed.   Constitutional:   Alert and oriented. Non-toxic appearance. Eyes:   Conjunctivae are normal. EOMI. PERRL. ENT      Head:   Normocephalic and atraumatic.      Nose:   Wearing a mask.      Mouth/Throat:   Wearing a mask.  Neck:   No meningismus. Full ROM. Hematological/Lymphatic/Immunilogical:   No cervical lymphadenopathy. Cardiovascular:   RRR. Symmetric bilateral radial and DP pulses.  No murmurs. Cap refill less than 2 seconds. Respiratory:   Normal respiratory effort without tachypnea/retractions. Breath sounds are clear and equal bilaterally. No wheezes/rales/rhonchi. Gastrointestinal:   Soft with diffuse right-sided abdominal tenderness. Non distended.   Mild guarding without rebound or rigidity  Musculoskeletal:   Normal range of motion in all extremities. No joint effusions.  No lower extremity tenderness.  No edema. Neurologic:   Normal speech and language.  Motor grossly intact. No acute focal neurologic deficits are appreciated.  Skin:    Skin is warm, dry and intact. No rash noted.  No petechiae, purpura, or bullae.  ____________________________________________    LABS (pertinent positives/negatives) (all labs ordered are listed, but only abnormal results are displayed) Labs Reviewed  COMPREHENSIVE METABOLIC PANEL - Abnormal; Notable for the following components:      Result Value   Glucose, Bld 240 (*)    AST 44 (*)    ALT 58 (*)    All other components within normal limits  URINALYSIS, COMPLETE (UACMP) WITH MICROSCOPIC - Abnormal; Notable for the following components:   Color, Urine STRAW (*)    APPearance CLEAR (*)    Glucose, UA >=500 (*)    Ketones, ur 5 (*)    All other components within normal limits  CBC  LIPASE, BLOOD    ____________________________________________   EKG    ____________________________________________    RADIOLOGY  CT Renal Stone Study  Result Date: 08/15/2020 CLINICAL DATA:  Acute right flank pain EXAM: CT ABDOMEN AND PELVIS WITHOUT CONTRAST TECHNIQUE: Multidetector CT imaging of the abdomen and pelvis was performed following the standard protocol without IV contrast. COMPARISON:  None. FINDINGS: Lower chest: No acute abnormality. Hepatobiliary: Hepatic steatosis. Gallbladder is unremarkable. No biliary dilatation. Pancreas: Unremarkable. Spleen: Unremarkable. Adrenals/Urinary Tract: There is a 2 mm calculus at the right ureterovesical junction. Mild right hydroureteronephrosis. Right perinephric stranding. Punctate 1 mm nonobstructing calculus of the interpolar right kidney. Adrenals are unremarkable. Bladder is poorly distended but otherwise unremarkable. Stomach/Bowel: Stomach is within normal limits. Bowel is normal in caliber. Normal appendix. Vascular/Lymphatic: Mild aortic atherosclerosis. No enlarged lymph nodes identified. Reproductive: Uterus and bilateral adnexa are unremarkable. Other: No ascites.  Abdominal wall is unremarkable. Musculoskeletal: No acute osseous abnormality. IMPRESSION: 2 mm obstructing calculus at the right ureterovesical junction with mild hydroureteronephrosis. Punctate nonobstructing right renal calculus. Hepatic steatosis. Electronically Signed   By: Macy Mis M.D.   On: 08/15/2020 11:05    ____________________________________________   PROCEDURES Procedures  ____________________________________________  DIFFERENTIAL DIAGNOSIS   Ureterolithiasis, appendicitis, diverticulitis, cholecystitis  CLINICAL IMPRESSION / ASSESSMENT AND PLAN / ED COURSE  Medications ordered in the ED: Medications  ketorolac (TORADOL) injection 15 mg (15 mg Intramuscular Given 08/15/20 1040)    Pertinent labs & imaging results that were available during my care  of the patient were reviewed by me and considered in my medical decision making (see chart for details).  Marissa Baldwin was evaluated in Emergency Department on 08/15/2020 for the symptoms described in the history of present illness. She was evaluated in the context of the global COVID-19 pandemic, which necessitated consideration that the patient might be at risk for infection with the SARS-CoV-2 virus that causes COVID-19. Institutional protocols and algorithms that pertain to the evaluation of patients at risk for COVID-19 are in a state of rapid change based on information released by regulatory  bodies including the CDC and federal and state organizations. These policies and algorithms were followed during the patient's care in the ED.   Patient presents with right-sided abdominal pain and tenderness.  Vital signs are normal, labs unremarkable with normal LFTs and lipase.  Symptoms most suggestive of ureterolithiasis, but with abdominal tenderness I will obtain a CT scan to evaluate for appendicitis or other infectious process.  Will give intramuscular Toradol 15 mg for pain control in the meantime.   ----------------------------------------- 12:16 PM on 08/15/2020 -----------------------------------------  Vitals remain stable, pain controlled.  CT shows 2 mm kidney stone at the right UVJ, expected to pass imminently at which point patient will feel much better.  Will DC home with prescription for Zofran and Toradol, recommend hydration and follow-up with PCP.     ____________________________________________   FINAL CLINICAL IMPRESSION(S) / ED DIAGNOSES    Final diagnoses:  Ureterolithiasis     ED Discharge Orders         Ordered    ketorolac (TORADOL) 10 MG tablet  Every 6 hours PRN        08/15/20 1215    ondansetron (ZOFRAN ODT) 4 MG disintegrating tablet  Every 8 hours PRN        08/15/20 1215          Portions of this note were generated with dragon dictation software.  Dictation errors may occur despite best attempts at proofreading.   Carrie Mew, MD 08/15/20 1217

## 2020-08-15 NOTE — Discharge Instructions (Signed)
Your CT scan shows a 2 mm kidney stone just outside your bladder on the right side which is causing your symptoms.  This should pass into your bladder soon, at which point your symptoms will substantially improve.  In the meantime, continue taking Toradol every 6 hours as needed for pain, and drink lots of fluids to stay hydrated and maintain good urine output.

## 2020-08-15 NOTE — ED Notes (Signed)
Pt had x 2 attempt to have bowel movement no success

## 2020-08-15 NOTE — ED Triage Notes (Signed)
Pt in with co right sided abd pain since 0400 no radiation to back. No vomiting or diarrhea but does have urinary urgency.

## 2020-08-19 ENCOUNTER — Other Ambulatory Visit: Payer: Self-pay | Admitting: Nurse Practitioner

## 2020-08-19 DIAGNOSIS — N200 Calculus of kidney: Secondary | ICD-10-CM

## 2020-08-19 NOTE — Progress Notes (Signed)
Referral urology 

## 2020-08-19 NOTE — Telephone Encounter (Signed)
Copied from [place] [id number]. Topic: General - Inquiry >> Aug 19, 2020  2:12 PM Gillis Ends D wrote: Reason for CRM: Patient called and she would like a referral to the Urologist. Please advise

## 2020-08-26 ENCOUNTER — Encounter: Payer: Self-pay | Admitting: Urology

## 2020-08-26 ENCOUNTER — Other Ambulatory Visit: Payer: Self-pay

## 2020-08-26 ENCOUNTER — Ambulatory Visit (INDEPENDENT_AMBULATORY_CARE_PROVIDER_SITE_OTHER): Payer: BC Managed Care – PPO | Admitting: Urology

## 2020-08-26 VITALS — BP 131/79 | HR 84 | Ht 59.0 in | Wt 164.2 lb

## 2020-08-26 DIAGNOSIS — N200 Calculus of kidney: Secondary | ICD-10-CM | POA: Diagnosis not present

## 2020-08-26 DIAGNOSIS — N201 Calculus of ureter: Secondary | ICD-10-CM

## 2020-08-26 LAB — MICROSCOPIC EXAMINATION
Bacteria, UA: NONE SEEN
RBC, Urine: NONE SEEN /hpf (ref 0–2)

## 2020-08-26 LAB — URINALYSIS, COMPLETE
Bilirubin, UA: NEGATIVE
Ketones, UA: NEGATIVE
Leukocytes,UA: NEGATIVE
Nitrite, UA: NEGATIVE
Protein,UA: NEGATIVE
RBC, UA: NEGATIVE
Specific Gravity, UA: 1.015 (ref 1.005–1.030)
Urobilinogen, Ur: 0.2 mg/dL (ref 0.2–1.0)
pH, UA: 6 (ref 5.0–7.5)

## 2020-08-26 NOTE — Progress Notes (Signed)
08/26/20 9:25 AM   Marissa Baldwin Nov 01, 1965 [id number]  CC: Right distal ureteral stone, urinary symptoms  HPI: I saw Ms. Beebe in urology clinic today for evaluation of a 2 mm right distal ureteral stone.  She is a 54 year old healthy female with a history of anxiety who presented to the ED on 08/15/2020 with severe right-sided flank and lower abdominal pain and some urinary symptoms.  CT showed a 2 mm right distal ureteral stone, urinalysis was benign, and she was discharged with medical expulsive therapy.  She reports she has not had any pain since that time, but continues to have some urinary frequency and feeling of incomplete emptying.  Urinalysis today is completely benign with 0-5 WBCs, 0 RBCs, no bacteria, nitrite negative.  She denies any prior stone episodes.  She also has had some problems with constipation since being discharged from the ED.  She is not taking any pain medications at this time.  She denies any fevers or chills.   PMH: Past Medical History:  Diagnosis Date   Anxiety    Arthritis 11/19   Hands hip shoulders   BPPV (benign paroxysmal positional vertigo)    Depression 04/2018   Golfers elbow of left upper extremity    Hyperlipidemia    Hypertension    Kidney stone    Macular degeneration    Morton's neuroma of right foot    Postmenopausal symptoms    Sleep apnea    CPAP    Surgical History: Past Surgical History:  Procedure Laterality Date   COLONOSCOPY WITH PROPOFOL N/A 07/20/2016   Procedure: COLONOSCOPY WITH PROPOFOL;  Surgeon: Lucilla Lame, MD;  Location: [place];  Service: Endoscopy;  Laterality: N/A;  sleep apnea   ESOPHAGOGASTRODUODENOSCOPY     POLYPECTOMY  07/20/2016   Procedure: POLYPECTOMY;  Surgeon: Lucilla Lame, MD;  Location: [place];  Service: Endoscopy;;   Family History: Family History  Problem Relation Age of Onset   Diabetes Father    Hypertension Father    Heart disease Father    Stroke  Father        mini strokes   Neuropathy Father    Depression Father    Cancer Maternal Aunt        rectal   Varicose Veins Maternal Grandmother    Diabetes Maternal Grandfather    Heart disease Paternal Grandfather    COPD Neg Hx     Social History:  reports that she has never smoked. She has never used smokeless tobacco. She reports current alcohol use. She reports that she does not use drugs.  Physical Exam: BP 131/79 (BP Location: Left Arm, Patient Position: Sitting, Cuff Size: Large)    Pulse 84    Ht 4\' 11"  (1.499 m)    Wt 164 lb 3.2 oz (74.5 kg)    BMI 33.16 kg/m    Constitutional:  Alert and oriented, No acute distress. Cardiovascular: No clubbing, cyanosis, or edema. Respiratory: Normal respiratory effort, no increased work of breathing. GI: Abdomen is soft, nontender, nondistended, no abdominal masses GU: No CVA tenderness  Laboratory Data: Reviewed, see HPI  Pertinent Imaging: I have personally reviewed the CT dated 08/15/2020 showing a 2 mm right distal ureteral stone with mild hydronephrosis, no other significant renal stones.  Assessment & Plan:   She is a 54 year old female with no prior stone episodes who presented to the ED on 08/15/2020 with a 2 mm right distal ureteral stone with no evidence of infection.  Her pain  is completely resolved, but she continues to have some urinary symptoms of urgency and feeling of incomplete emptying.  Urinalysis is completely benign today.  We discussed various treatment options for urolithiasis including observation with or without medical expulsive therapy, shockwave lithotripsy (SWL), ureteroscopy and laser lithotripsy with stent placement, and percutaneous nephrolithotomy.  We discussed that management is based on stone size, location, density, patient co-morbidities, and patient preference.   Stones <72mm in size have a >80% spontaneous passage rate. Data surrounding the use of tamsulosin for medical expulsive therapy  is controversial, but meta analyses suggests it is most efficacious for distal stones between 5-47mm in size. Possible side effects include dizziness/lightheadedness, and retrograde ejaculation.  Ureteroscopy with laser lithotripsy and stent placement has a higher stone free rate than SWL in a single procedure, however increased complication rate including possible infection, ureteral injury, bleeding, and stent related morbidity. Common stent related symptoms include dysuria, urgency/frequency, and flank pain.  After an extensive discussion of the risks and benefits of the above treatment options, the patient would like to proceed with medical expulsive therapy.  I recommended close follow-up in 2 weeks with a KUB to confirm stone passage, and we discussed return precautions at length.  Continue Flomax, NSAIDs for any recurrent renal colic RTC 2 weeks with KUB prior to confirm stone passage Return precautions discussed at length   Nickolas Madrid, MD 08/26/2020  [facility] [address], [place], [place] [address] [phone]

## 2020-08-26 NOTE — Patient Instructions (Addendum)
1.  Take anti-inflammatories like Aleve or ibuprofen for any recurrent flank or abdominal pain 2.  Recommend MiraLAX or Dulcolax for constipation 3.  Call the clinic if you have severe persistent kidney stone pain, or fever over 101.3  Kidney Stones  Kidney stones are solid, rock-like deposits that form inside of the kidneys. The kidneys are a pair of organs that make urine. A kidney stone may form in a kidney and move into other parts of the urinary tract, including the tubes that connect the kidneys to the bladder (ureters), the bladder, and the tube that carries urine out of the body (urethra). As the stone moves through these areas, it can cause intense pain and block the flow of urine. Kidney stones are created when high levels of certain minerals are found in the urine. The stones are usually passed out of the body through urination, but in some cases, medical treatment may be needed to remove them. What are the causes? Kidney stones may be caused by:  A condition in which certain glands produce too much parathyroid hormone (primary hyperparathyroidism), which causes too much calcium buildup in the blood.  A buildup of uric acid crystals in the bladder (hyperuricosuria). Uric acid is a chemical that the body produces when you eat certain foods. It usually exits the body in the urine.  Narrowing (stricture) of one or both of the ureters.  A kidney blockage that is present at birth (congenital obstruction).  Past surgery on the kidney or the ureters, such as gastric bypass surgery. What increases the risk? The following factors may make you more likely to develop this condition:  Having had a kidney stone in the past.  Having a family history of kidney stones.  Not drinking enough water.  Eating a diet that is high in protein, salt (sodium), or sugar.  Being overweight or obese. What are the signs or symptoms? Symptoms of a kidney stone may include:  Pain in the side of the  abdomen, right below the ribs (flank pain). Pain usually spreads (radiates) to the groin.  Needing to urinate frequently or urgently.  Painful urination.  Blood in the urine (hematuria).  Nausea.  Vomiting.  Fever and chills. How is this diagnosed? This condition may be diagnosed based on:  Your symptoms and medical history.  A physical exam.  Blood tests.  Urine tests. These may be done before and after the stone passes out of your body through urination.  Imaging tests, such as a CT scan, abdominal X-ray, or ultrasound.  A procedure to examine the inside of the bladder (cystoscopy). How is this treated? Treatment for kidney stones depends on the size, location, and makeup of the stones. Kidney stones will often pass out of the body through urination. You may need to:  Increase your fluid intake to help pass the stone. In some cases, you may be given fluids through an IV and may need to be monitored at the hospital.  Take medicine for pain.  Make changes in your diet to help prevent kidney stones from coming back. Sometimes, medical procedures are needed to remove a kidney stone. This may involve:  A procedure to break up kidney stones using: ? A focused beam of light (laser therapy). ? Shock waves (extracorporeal shock wave lithotripsy).  Surgery to remove kidney stones. This may be needed if you have severe pain or have stones that block your urinary tract. Follow these instructions at home: Medicines  Take over-the-counter and prescription medicines only  as told by your health care provider.  Ask your health care provider if the medicine prescribed to you requires you to avoid driving or using heavy machinery. Eating and drinking  Drink enough fluid to keep your urine pale yellow. You may be instructed to drink at least 8-10 glasses of water each day. This will help you pass the kidney stone.  If directed, change your diet. This may include: ? Limiting how  much sodium you eat. ? Eating more fruits and vegetables. ? Limiting how much animal protein--such as red meat, poultry, fish, and eggs--you eat.  Follow instructions from your health care provider about eating or drinking restrictions. General instructions  Collect urine samples as told by your health care provider. You may need to collect a urine sample: ? 24 hours after you pass the stone. ? 8-12 weeks after passing the kidney stone, and every 6-12 months after that.  Strain your urine every time you urinate, for as long as directed. Use the strainer that your health care provider recommends.  Do not throw out the kidney stone after passing it. Keep the stone so it can be tested by your health care provider. Testing the makeup of your kidney stone may help prevent you from getting kidney stones in the future.  Keep all follow-up visits as told by your health care provider. This is important. You may need follow-up X-rays or ultrasounds to make sure that your stone has passed. How is this prevented? To prevent another kidney stone:  Drink enough fluid to keep your urine pale yellow. This is the best way to prevent kidney stones.  Eat a healthy diet and follow recommendations from your health care provider about foods to avoid. You may be instructed to eat a low-protein diet. Recommendations vary depending on the type of kidney stone that you have.  Maintain a healthy weight. Where to find more information  [place] ([facility]): www.[email or web address].[facility]): www.urologyhealth.org Contact a health care provider if:  You have pain that gets worse or does not get better with medicine. Get help right away if:  You have a fever or chills.  You develop severe pain.  You develop new abdominal pain.  You faint.  You are unable to urinate. Summary  Kidney stones are solid, rock-like deposits that form inside of the kidneys.  Kidney stones can cause  nausea, vomiting, blood in the urine, abdominal pain, and the urge to urinate frequently.  Treatment for kidney stones depends on the size, location, and makeup of the stones. Kidney stones will often pass out of the body through urination.  Kidney stones can be prevented by drinking enough fluids, eating a healthy diet, and maintaining a healthy weight. This information is not intended to replace advice given to you by your health care provider. Make sure you discuss any questions you have with your health care provider. Document Revised: 02/21/2019 Document Reviewed: 02/21/2019 Elsevier Patient Education  2020 [place].   Dietary Guidelines to Help Prevent Kidney Stones Kidney stones are deposits of minerals and salts that form inside your kidneys. Your risk of developing kidney stones may be greater depending on your diet, your lifestyle, the medicines you take, and whether you have certain medical conditions. Most people can reduce their chances of developing kidney stones by following the instructions below. Depending on your overall health and the type of kidney stones you tend to develop, your dietitian may give you more specific instructions. What are tips  for following this plan? Reading food labels  Choose foods with "no salt added" or "low-salt" labels. Limit your sodium intake to less than 1500 mg per day.  Choose foods with calcium for each meal and snack. Try to eat about 300 mg of calcium at each meal. Foods that contain 200-500 mg of calcium per serving include: ? 8 oz (237 ml) of milk, fortified nondairy milk, and fortified fruit juice. ? 8 oz (237 ml) of kefir, yogurt, and soy yogurt. ? 4 oz (118 ml) of tofu. ? 1 oz of cheese. ? 1 cup (300 g) of dried figs. ? 1 cup (91 g) of cooked broccoli. ? 1-3 oz can of sardines or mackerel.  Most people need 1000 to 1500 mg of calcium each day. Talk to your dietitian about how much calcium is recommended for you. Shopping  Buy  plenty of fresh fruits and vegetables. Most people do not need to avoid fruits and vegetables, even if they contain nutrients that may contribute to kidney stones.  When shopping for convenience foods, choose: ? Whole pieces of fruit. ? Premade salads with dressing on the side. ? Low-fat fruit and yogurt smoothies.  Avoid buying frozen meals or prepared deli foods.  Look for foods with live cultures, such as yogurt and kefir. Cooking  Do not add salt to food when cooking. Place a salt shaker on the table and allow each person to add his or her own salt to taste.  Use vegetable protein, such as beans, textured vegetable protein (TVP), or tofu instead of meat in pasta, casseroles, and soups. Meal planning   Eat less salt, if told by your dietitian. To do this: ? Avoid eating processed or premade food. ? Avoid eating fast food.  Eat less animal protein, including cheese, meat, poultry, or fish, if told by your dietitian. To do this: ? Limit the number of times you have meat, poultry, fish, or cheese each week. Eat a diet free of meat at least 2 days a week. ? Eat only one serving each day of meat, poultry, fish, or seafood. ? When you prepare animal protein, cut pieces into small portion sizes. For most meat and fish, one serving is about the size of one deck of cards.  Eat at least 5 servings of fresh fruits and vegetables each day. To do this: ? Keep fruits and vegetables on hand for snacks. ? Eat 1 piece of fruit or a handful of berries with breakfast. ? Have a salad and fruit at lunch. ? Have two kinds of vegetables at dinner.  Limit foods that are high in a substance called oxalate. These include: ? Spinach. ? Rhubarb. ? Beets. ? Potato chips and french fries. ? Nuts.  If you regularly take a diuretic medicine, make sure to eat at least 1-2 fruits or vegetables high in potassium each day. These include: ? Avocado. ? Banana. ? Orange, prune, carrot, or tomato  juice. ? Baked potato. ? Cabbage. ? Beans and split peas. General instructions   Drink enough fluid to keep your urine clear or pale yellow. This is the most important thing you can do.  Talk to your health care provider and dietitian about taking daily supplements. Depending on your health and the cause of your kidney stones, you may be advised: ? Not to take supplements with vitamin C. ? To take a calcium supplement. ? To take a daily probiotic supplement. ? To take other supplements such as magnesium, fish oil, or vitamin  B6.  Take all medicines and supplements as told by your health care provider.  Limit alcohol intake to no more than 1 drink a day for nonpregnant women and 2 drinks a day for men. One drink equals 12 oz of beer, 5 oz of wine, or 1 oz of hard liquor.  Lose weight if told by your health care provider. Work with your dietitian to find strategies and an eating plan that works best for you. What foods are not recommended? Limit your intake of the following foods, or as told by your dietitian. Talk to your dietitian about specific foods you should avoid based on the type of kidney stones and your overall health. Grains Breads. Bagels. Rolls. Baked goods. Salted crackers. Cereal. Pasta. Vegetables Spinach. Rhubarb. Beets. Canned vegetables. Marissa Baldwin. Olives. Meats and other protein foods Nuts. Nut butters. Large portions of meat, poultry, or fish. Salted or cured meats. Deli meats. Hot dogs. Sausages. Dairy Cheese. Beverages Regular soft drinks. Regular vegetable juice. Seasonings and other foods Seasoning blends with salt. Salad dressings. Canned soups. Soy sauce. Ketchup. Barbecue sauce. Canned pasta sauce. Casseroles. Pizza. Lasagna. Frozen meals. Potato chips. [place] fries. Summary  You can reduce your risk of kidney stones by making changes to your diet.  The most important thing you can do is drink enough fluid. You should drink enough fluid to keep your  urine clear or pale yellow.  Ask your health care provider or dietitian how much protein from animal sources you should eat each day, and also how much salt and calcium you should have each day. This information is not intended to replace advice given to you by your health care provider. Make sure you discuss any questions you have with your health care provider. Document Revised: 01/25/2019 Document Reviewed: 09/15/2016 Elsevier Patient Education  2020 [place] for Kidney Stones Laser therapy for kidney stones is a procedure to break up small, hard mineral deposits that form in the kidney (kidney stones). The procedure is done using a device that produces a focused beam of light (laser). The laser breaks up kidney stones into pieces that are small enough to be passed out of the body through urination or removed from the body during the procedure. You may need laser therapy if you have kidney stones that are painful or block your urinary tract. This procedure is done by inserting a tube (ureteroscope) into your kidney through the urethral opening. The urethra is the part of the body that drains urine from the bladder. In women, the urethra opens above the vaginal opening. In men, the urethra opens at the tip of the penis. The ureteroscope is inserted through the urethra, and surgical instruments are moved through the bladder and the muscular tube that connects the kidney to the bladder (ureter) until they reach the kidney. Tell a health care provider about:  Any allergies you have.  All medicines you are taking, including vitamins, herbs, eye drops, creams, and over-the-counter medicines.  Any problems you or family members have had with anesthetic medicines.  Any blood disorders you have.  Any surgeries you have had.  Any medical conditions you have.  Whether you are pregnant or may be pregnant. What are the risks? Generally, this is a safe procedure. However,  problems may occur, including:  Infection.  Bleeding.  Allergic reactions to medicines.  Damage to the urethra, bladder, or ureter.  Urinary tract infection (UTI).  Narrowing of the urethra (urethral stricture).  Difficulty passing urine.  Blockage of the kidney caused by a fragment of kidney stone. What happens before the procedure? Medicines  Ask your health care provider about: ? Changing or stopping your regular medicines. This is especially important if you are taking diabetes medicines or blood thinners. ? Taking medicines such as aspirin and ibuprofen. These medicines can thin your blood. Do not take these medicines unless your health care provider tells you to take them. ? Taking over-the-counter medicines, vitamins, herbs, and supplements. Eating and drinking Follow instructions from your health care provider about eating and drinking, which may include:  8 hours before the procedure - stop eating heavy meals or foods, such as meat, fried foods, or fatty foods.  6 hours before the procedure - stop eating light meals or foods, such as toast or cereal.  6 hours before the procedure - stop drinking milk or drinks that contain milk.  2 hours before the procedure - stop drinking clear liquids. Staying hydrated Follow instructions from your health care provider about hydration, which may include:  Up to 2 hours before the procedure - you may continue to drink clear liquids, such as water, clear fruit juice, black coffee, and plain tea.  General instructions  You may have a physical exam before the procedure. You may also have tests, such as imaging tests and blood or urine tests.  If your ureter is too narrow, your health care provider may place a soft, flexible tube (stent) inside of it. The stent may be placed days or weeks before your laser therapy procedure.  Plan to have someone take you home from the hospital or clinic.  If you will be going home right after  the procedure, plan to have someone stay with you for 24 hours.  Do not use any products that contain nicotine or tobacco for at least 4 weeks before the procedure. These products include cigarettes, e-cigarettes, and chewing tobacco. If you need help quitting, ask your health care provider.  Ask your health care provider: ? How your surgical site will be marked or identified. ? What steps will be taken to help prevent infection. These may include:  Removing hair at the surgery site.  Washing skin with a germ-killing soap.  Taking antibiotic medicine. What happens during the procedure?   An IV will be inserted into one of your veins.  You will be given one or more of the following: ? A medicine to help you relax (sedative). ? A medicine to numb the area (local anesthetic). ? A medicine to make you fall asleep (general anesthetic).  A ureteroscope will be inserted into your urethra. The ureteroscope will send images to a video screen in the operating room to guide your surgeon to the area of your kidney that will be treated.  A small, flexible tube will be threaded through the ureteroscope and into your bladder and ureter, up to your kidney.  The laser device will be inserted into your kidney through the tube. Your surgeon will pulse the laser on and off to break up kidney stones.  A surgical instrument that has a tiny wire basket may be inserted through the tube into your kidney to remove the pieces of broken kidney stone. The procedure may vary among health care providers and hospitals. What happens after the procedure?  Your blood pressure, heart rate, breathing rate, and blood oxygen level will be monitored until you leave the hospital or clinic.  You will be given pain medicine as needed.  You may continue  to receive antibiotics.  You may have a stent temporarily placed in your ureter.  Do not drive for 24 hours if you were given a sedative during your procedure.  You  may be given a strainer to collect any stone fragments that you pass in your urine. Your health care provider may have these tested. Summary  Laser therapy for kidney stones is a procedure to break up kidney stones into pieces that are small enough to be passed out of the body through urination or removed during the procedure.  Follow instructions from your health care provider about eating and drinking before the procedure.  During the procedure, the ureteroscope will send images to a video screen to guide your surgeon to the area of your kidney that will be treated.  Do not drive for 24 hours if you were given a sedative during your procedure. This information is not intended to replace advice given to you by your health care provider. Make sure you discuss any questions you have with your health care provider. Document Revised: 06/16/2018 Document Reviewed: 06/16/2018 Elsevier Patient Education  2020 [employer].

## 2020-08-27 DIAGNOSIS — Z23 Encounter for immunization: Secondary | ICD-10-CM | POA: Diagnosis not present

## 2020-09-06 ENCOUNTER — Other Ambulatory Visit: Payer: Self-pay | Admitting: Family Medicine

## 2020-09-06 NOTE — Telephone Encounter (Signed)
Called and LVM asking for patient to please return my call.  

## 2020-09-06 NOTE — Telephone Encounter (Signed)
Please clarify with patient - we have not documented that she is taking this medication in recent office notes.

## 2020-09-06 NOTE — Telephone Encounter (Signed)
   Notes to clinic:  filled by a historical provider Review for refill   Requested Prescriptions  Pending Prescriptions Disp Refills   omeprazole (PRILOSEC) 40 MG capsule [Pharmacy Med Name: OMEPRAZOLE 40MG  CAPSULES] 30 capsule     Sig: TAKE 1 CAPSULE(40 MG) BY MOUTH DAILY      Gastroenterology: Proton Pump Inhibitors Passed - 09/06/2020  3:36 AM      Passed - Valid encounter within last 12 months    Recent Outpatient Visits           2 months ago Type 2 diabetes mellitus with obesity ([place])   [place], [place] T, NP   5 months ago Left hand pain   [place], [place], [place]   8 months ago Left wrist pain   [place], [place], [place]   1 year ago Essential hypertension, benign   [place], Lilia Argue, [place]   1 year ago Essential hypertension, benign   [place], Lilia Argue, [place]       Future Appointments             In 3 months Cannady, Barbaraann Faster, NP [employer], [facility]

## 2020-09-09 NOTE — Telephone Encounter (Signed)
Called and LVM asking for patient to please return my call.  

## 2020-09-11 ENCOUNTER — Encounter: Payer: Self-pay | Admitting: Urology

## 2020-09-11 ENCOUNTER — Ambulatory Visit (INDEPENDENT_AMBULATORY_CARE_PROVIDER_SITE_OTHER): Payer: BC Managed Care – PPO | Admitting: Urology

## 2020-09-11 ENCOUNTER — Ambulatory Visit
Admission: RE | Admit: 2020-09-11 | Discharge: 2020-09-11 | Disposition: A | Discharge status: Home / Self Care | Payer: BC Managed Care – PPO | Source: Ambulatory Visit | Attending: Urology | Admitting: Urology

## 2020-09-11 ENCOUNTER — Ambulatory Visit
Admission: RE | Admit: 2020-09-11 | Discharge: 2020-09-11 | Disposition: A | Discharge status: Home / Self Care | Payer: BC Managed Care – PPO | Attending: Urology | Admitting: Urology

## 2020-09-11 ENCOUNTER — Other Ambulatory Visit: Payer: Self-pay

## 2020-09-11 VITALS — BP 129/82 | HR 80 | Ht 59.0 in | Wt 163.0 lb

## 2020-09-11 DIAGNOSIS — I878 Other specified disorders of veins: Secondary | ICD-10-CM | POA: Diagnosis not present

## 2020-09-11 DIAGNOSIS — N200 Calculus of kidney: Secondary | ICD-10-CM | POA: Diagnosis not present

## 2020-09-11 DIAGNOSIS — Z87442 Personal history of urinary calculi: Secondary | ICD-10-CM | POA: Diagnosis not present

## 2020-09-11 NOTE — Progress Notes (Signed)
° °  09/11/2020 11:15 AM   Marissa Baldwin 27-Sep-1966 [id number]  Reason for visit: Follow up right distal ureteral stone  HPI: I saw [name] back in urology clinic for follow-up of a small right distal ureteral stone.  She is a 54 year old female who presented to the ED on 08/15/2020 with severe right-sided flank and abdominal pain and CT showed a 2 mm right distal ureteral stone.  She was discharged with medical expulsive therapy.  She denies any recurrent pain episodes since being discharged from the ED.  She denies any significant urinary symptoms.  Urinalysis today is benign with 0-5 WBCs, 0 RBCs, no bacteria, nitrite negative, no leukocytes.  I personally viewed and interpreted her KUB today that shows no evidence of the right distal ureteral stone.  There is a stable phlebolith in the right lower pelvis that is unchanged from recent CT.  We discussed that she most likely passed her stone spontaneously based on the small stone size, lack of persistent microscopic hematuria, and resolution of her pain symptoms.  Return precautions were discussed at length. We discussed general stone prevention strategies including adequate hydration with goal of producing 2.5 L of urine daily, increasing citric acid intake, increasing calcium intake during high oxalate meals, minimizing animal protein, and decreasing salt intake. Information about dietary recommendations given today.   Follow-up as needed   Billey Co, MD  [facility] [address] [place], [place] [address] [phone]

## 2020-09-11 NOTE — Patient Instructions (Signed)
Dietary Guidelines to Help Prevent Kidney Stones Kidney stones are deposits of minerals and salts that form inside your kidneys. Your risk of developing kidney stones may be greater depending on your diet, your lifestyle, the medicines you take, and whether you have certain medical conditions. Most people can reduce their chances of developing kidney stones by following the instructions below. Depending on your overall health and the type of kidney stones you tend to develop, your dietitian may give you more specific instructions. What are tips for following this plan? Reading food labels  Choose foods with "no salt added" or "low-salt" labels. Limit your sodium intake to less than 1500 mg per day.  Choose foods with calcium for each meal and snack. Try to eat about 300 mg of calcium at each meal. Foods that contain 200-500 mg of calcium per serving include: ? 8 oz (237 ml) of milk, fortified nondairy milk, and fortified fruit juice. ? 8 oz (237 ml) of kefir, yogurt, and soy yogurt. ? 4 oz (118 ml) of tofu. ? 1 oz of cheese. ? 1 cup (300 g) of dried figs. ? 1 cup (91 g) of cooked broccoli. ? 1-3 oz can of sardines or mackerel.  Most people need 1000 to 1500 mg of calcium each day. Talk to your dietitian about how much calcium is recommended for you. Shopping  Buy plenty of fresh fruits and vegetables. Most people do not need to avoid fruits and vegetables, even if they contain nutrients that may contribute to kidney stones.  When shopping for convenience foods, choose: ? Whole pieces of fruit. ? Premade salads with dressing on the side. ? Low-fat fruit and yogurt smoothies.  Avoid buying frozen meals or prepared deli foods.  Look for foods with live cultures, such as yogurt and kefir. Cooking  Do not add salt to food when cooking. Place a salt shaker on the table and allow each person to add his or her own salt to taste.  Use vegetable protein, such as beans, textured vegetable  protein (TVP), or tofu instead of meat in pasta, casseroles, and soups. Meal planning   Eat less salt, if told by your dietitian. To do this: ? Avoid eating processed or premade food. ? Avoid eating fast food.  Eat less animal protein, including cheese, meat, poultry, or fish, if told by your dietitian. To do this: ? Limit the number of times you have meat, poultry, fish, or cheese each week. Eat a diet free of meat at least 2 days a week. ? Eat only one serving each day of meat, poultry, fish, or seafood. ? When you prepare animal protein, cut pieces into small portion sizes. For most meat and fish, one serving is about the size of one deck of cards.  Eat at least 5 servings of fresh fruits and vegetables each day. To do this: ? Keep fruits and vegetables on hand for snacks. ? Eat 1 piece of fruit or a handful of berries with breakfast. ? Have a salad and fruit at lunch. ? Have two kinds of vegetables at dinner.  Limit foods that are high in a substance called oxalate. These include: ? Spinach. ? Rhubarb. ? Beets. ? Potato chips and french fries. ? Nuts.  If you regularly take a diuretic medicine, make sure to eat at least 1-2 fruits or vegetables high in potassium each day. These include: ? Avocado. ? Banana. ? Orange, prune, carrot, or tomato juice. ? Baked potato. ? Cabbage. ? Beans and split   peas. General instructions   Drink enough fluid to keep your urine clear or pale yellow. This is the most important thing you can do.  Talk to your health care provider and dietitian about taking daily supplements. Depending on your health and the cause of your kidney stones, you may be advised: ? Not to take supplements with vitamin C. ? To take a calcium supplement. ? To take a daily probiotic supplement. ? To take other supplements such as magnesium, fish oil, or vitamin B6.  Take all medicines and supplements as told by your health care provider.  Limit alcohol intake to no  more than 1 drink a day for nonpregnant women and 2 drinks a day for men. One drink equals 12 oz of beer, 5 oz of wine, or 1 oz of hard liquor.  Lose weight if told by your health care provider. Work with your dietitian to find strategies and an eating plan that works best for you. What foods are not recommended? Limit your intake of the following foods, or as told by your dietitian. Talk to your dietitian about specific foods you should avoid based on the type of kidney stones and your overall health. Grains Breads. Bagels. Rolls. Baked goods. Salted crackers. Cereal. Pasta. Vegetables Spinach. Rhubarb. Beets. Canned vegetables. Angie Fava. Olives. Meats and other protein foods Nuts. Nut butters. Large portions of meat, poultry, or fish. Salted or cured meats. Deli meats. Hot dogs. Sausages. Dairy Cheese. Beverages Regular soft drinks. Regular vegetable juice. Seasonings and other foods Seasoning blends with salt. Salad dressings. Canned soups. Soy sauce. Ketchup. Barbecue sauce. Canned pasta sauce. Casseroles. Pizza. Lasagna. Frozen meals. Potato chips. [place] fries. Summary  You can reduce your risk of kidney stones by making changes to your diet.  The most important thing you can do is drink enough fluid. You should drink enough fluid to keep your urine clear or pale yellow.  Ask your health care provider or dietitian how much protein from animal sources you should eat each day, and also how much salt and calcium you should have each day. This information is not intended to replace advice given to you by your health care provider. Make sure you discuss any questions you have with your health care provider. Document Revised: 01/25/2019 Document Reviewed: 09/15/2016 Elsevier Patient Education  2020 [place]. Kidney Stones  Kidney stones are solid, rock-like deposits that form inside of the kidneys. The kidneys are a pair of organs that make urine. A kidney stone may form in a kidney  and move into other parts of the urinary tract, including the tubes that connect the kidneys to the bladder (ureters), the bladder, and the tube that carries urine out of the body (urethra). As the stone moves through these areas, it can cause intense pain and block the flow of urine. Kidney stones are created when high levels of certain minerals are found in the urine. The stones are usually passed out of the body through urination, but in some cases, medical treatment may be needed to remove them. What are the causes? Kidney stones may be caused by:  A condition in which certain glands produce too much parathyroid hormone (primary hyperparathyroidism), which causes too much calcium buildup in the blood.  A buildup of uric acid crystals in the bladder (hyperuricosuria). Uric acid is a chemical that the body produces when you eat certain foods. It usually exits the body in the urine.  Narrowing (stricture) of one or both of the ureters.  A  kidney blockage that is present at birth (congenital obstruction).  Past surgery on the kidney or the ureters, such as gastric bypass surgery. What increases the risk? The following factors may make you more likely to develop this condition:  Having had a kidney stone in the past.  Having a family history of kidney stones.  Not drinking enough water.  Eating a diet that is high in protein, salt (sodium), or sugar.  Being overweight or obese. What are the signs or symptoms? Symptoms of a kidney stone may include:  Pain in the side of the abdomen, right below the ribs (flank pain). Pain usually spreads (radiates) to the groin.  Needing to urinate frequently or urgently.  Painful urination.  Blood in the urine (hematuria).  Nausea.  Vomiting.  Fever and chills. How is this diagnosed? This condition may be diagnosed based on:  Your symptoms and medical history.  A physical exam.  Blood tests.  Urine tests. These may be done before and  after the stone passes out of your body through urination.  Imaging tests, such as a CT scan, abdominal X-ray, or ultrasound.  A procedure to examine the inside of the bladder (cystoscopy). How is this treated? Treatment for kidney stones depends on the size, location, and makeup of the stones. Kidney stones will often pass out of the body through urination. You may need to:  Increase your fluid intake to help pass the stone. In some cases, you may be given fluids through an IV and may need to be monitored at the hospital.  Take medicine for pain.  Make changes in your diet to help prevent kidney stones from coming back. Sometimes, medical procedures are needed to remove a kidney stone. This may involve:  A procedure to break up kidney stones using: ? A focused beam of light (laser therapy). ? Shock waves (extracorporeal shock wave lithotripsy).  Surgery to remove kidney stones. This may be needed if you have severe pain or have stones that block your urinary tract. Follow these instructions at home: Medicines  Take over-the-counter and prescription medicines only as told by your health care provider.  Ask your health care provider if the medicine prescribed to you requires you to avoid driving or using heavy machinery. Eating and drinking  Drink enough fluid to keep your urine pale yellow. You may be instructed to drink at least 8-10 glasses of water each day. This will help you pass the kidney stone.  If directed, change your diet. This may include: ? Limiting how much sodium you eat. ? Eating more fruits and vegetables. ? Limiting how much animal protein--such as red meat, poultry, fish, and eggs--you eat.  Follow instructions from your health care provider about eating or drinking restrictions. General instructions  Collect urine samples as told by your health care provider. You may need to collect a urine sample: ? 24 hours after you pass the stone. ? 8-12 weeks after  passing the kidney stone, and every 6-12 months after that.  Strain your urine every time you urinate, for as long as directed. Use the strainer that your health care provider recommends.  Do not throw out the kidney stone after passing it. Keep the stone so it can be tested by your health care provider. Testing the makeup of your kidney stone may help prevent you from getting kidney stones in the future.  Keep all follow-up visits as told by your health care provider. This is important. You may need follow-up X-rays or  ultrasounds to make sure that your stone has passed. How is this prevented? To prevent another kidney stone:  Drink enough fluid to keep your urine pale yellow. This is the best way to prevent kidney stones.  Eat a healthy diet and follow recommendations from your health care provider about foods to avoid. You may be instructed to eat a low-protein diet. Recommendations vary depending on the type of kidney stone that you have.  Maintain a healthy weight. Where to find more information  [place] [facility]): www.kidney.[facility]): www.urologyhealth.org Contact a health care provider if:  You have pain that gets worse or does not get better with medicine. Get help right away if:  You have a fever or chills.  You develop severe pain.  You develop new abdominal pain.  You faint.  You are unable to urinate. Summary  Kidney stones are solid, rock-like deposits that form inside of the kidneys.  Kidney stones can cause nausea, vomiting, blood in the urine, abdominal pain, and the urge to urinate frequently.  Treatment for kidney stones depends on the size, location, and makeup of the stones. Kidney stones will often pass out of the body through urination.  Kidney stones can be prevented by drinking enough fluids, eating a healthy diet, and maintaining a healthy weight. This information is not intended to replace advice given to  you by your health care provider. Make sure you discuss any questions you have with your health care provider. Document Revised: 02/21/2019 Document Reviewed: 02/21/2019 Elsevier Patient Education  [place].

## 2020-09-17 LAB — URINALYSIS, COMPLETE
Bilirubin, UA: NEGATIVE
Ketones, UA: NEGATIVE
Leukocytes,UA: NEGATIVE
Nitrite, UA: NEGATIVE
Protein,UA: NEGATIVE
RBC, UA: NEGATIVE
Specific Gravity, UA: 1.02 (ref 1.005–1.030)
Urobilinogen, Ur: 0.2 mg/dL (ref 0.2–1.0)
pH, UA: 7.5 (ref 5.0–7.5)

## 2020-09-17 LAB — MICROSCOPIC EXAMINATION
Bacteria, UA: NONE SEEN
RBC, Urine: NONE SEEN /hpf (ref 0–2)

## 2020-09-18 ENCOUNTER — Other Ambulatory Visit: Payer: Self-pay | Admitting: Family Medicine

## 2020-09-30 ENCOUNTER — Other Ambulatory Visit: Payer: Self-pay | Admitting: Nurse Practitioner

## 2020-09-30 MED ORDER — METFORMIN HCL ER 500 MG PO TB24: ORAL_TABLET | ORAL | 0 refills | Status: DC

## 2020-09-30 MED ORDER — METFORMIN HCL ER 500 MG PO TB24
ORAL_TABLET | ORAL | 4 refills | Status: DC
Start: 2020-09-30 — End: 2021-05-07

## 2020-10-11 ENCOUNTER — Other Ambulatory Visit: Payer: Self-pay | Admitting: Family Medicine

## 2020-10-12 ENCOUNTER — Other Ambulatory Visit: Payer: Self-pay | Admitting: Family Medicine

## 2020-10-13 NOTE — Telephone Encounter (Signed)
Pt checks BS twice daily. Not on active med list on in history.

## 2020-10-14 NOTE — Telephone Encounter (Signed)
Routing to provider  

## 2020-11-11 DIAGNOSIS — M5416 Radiculopathy, lumbar region: Secondary | ICD-10-CM | POA: Diagnosis not present

## 2020-11-11 DIAGNOSIS — M9903 Segmental and somatic dysfunction of lumbar region: Secondary | ICD-10-CM | POA: Diagnosis not present

## 2020-11-11 DIAGNOSIS — M9905 Segmental and somatic dysfunction of pelvic region: Secondary | ICD-10-CM | POA: Diagnosis not present

## 2020-11-11 DIAGNOSIS — M5136 Other intervertebral disc degeneration, lumbar region: Secondary | ICD-10-CM | POA: Diagnosis not present

## 2020-11-12 DIAGNOSIS — M9903 Segmental and somatic dysfunction of lumbar region: Secondary | ICD-10-CM | POA: Diagnosis not present

## 2020-11-12 DIAGNOSIS — M5136 Other intervertebral disc degeneration, lumbar region: Secondary | ICD-10-CM | POA: Diagnosis not present

## 2020-11-12 DIAGNOSIS — M5416 Radiculopathy, lumbar region: Secondary | ICD-10-CM | POA: Diagnosis not present

## 2020-11-12 DIAGNOSIS — M9905 Segmental and somatic dysfunction of pelvic region: Secondary | ICD-10-CM | POA: Diagnosis not present

## 2020-11-13 DIAGNOSIS — M5136 Other intervertebral disc degeneration, lumbar region: Secondary | ICD-10-CM | POA: Diagnosis not present

## 2020-11-13 DIAGNOSIS — M9905 Segmental and somatic dysfunction of pelvic region: Secondary | ICD-10-CM | POA: Diagnosis not present

## 2020-11-13 DIAGNOSIS — M9903 Segmental and somatic dysfunction of lumbar region: Secondary | ICD-10-CM | POA: Diagnosis not present

## 2020-11-13 DIAGNOSIS — M5416 Radiculopathy, lumbar region: Secondary | ICD-10-CM | POA: Diagnosis not present

## 2020-11-14 DIAGNOSIS — M5416 Radiculopathy, lumbar region: Secondary | ICD-10-CM | POA: Diagnosis not present

## 2020-11-14 DIAGNOSIS — M9905 Segmental and somatic dysfunction of pelvic region: Secondary | ICD-10-CM | POA: Diagnosis not present

## 2020-11-14 DIAGNOSIS — M5136 Other intervertebral disc degeneration, lumbar region: Secondary | ICD-10-CM | POA: Diagnosis not present

## 2020-11-14 DIAGNOSIS — M9903 Segmental and somatic dysfunction of lumbar region: Secondary | ICD-10-CM | POA: Diagnosis not present

## 2020-11-18 DIAGNOSIS — M9905 Segmental and somatic dysfunction of pelvic region: Secondary | ICD-10-CM | POA: Diagnosis not present

## 2020-11-18 DIAGNOSIS — M9903 Segmental and somatic dysfunction of lumbar region: Secondary | ICD-10-CM | POA: Diagnosis not present

## 2020-11-18 DIAGNOSIS — M5136 Other intervertebral disc degeneration, lumbar region: Secondary | ICD-10-CM | POA: Diagnosis not present

## 2020-11-18 DIAGNOSIS — M5416 Radiculopathy, lumbar region: Secondary | ICD-10-CM | POA: Diagnosis not present

## 2020-11-20 DIAGNOSIS — M5416 Radiculopathy, lumbar region: Secondary | ICD-10-CM | POA: Diagnosis not present

## 2020-11-20 DIAGNOSIS — M9905 Segmental and somatic dysfunction of pelvic region: Secondary | ICD-10-CM | POA: Diagnosis not present

## 2020-11-20 DIAGNOSIS — M9903 Segmental and somatic dysfunction of lumbar region: Secondary | ICD-10-CM | POA: Diagnosis not present

## 2020-11-20 DIAGNOSIS — M5136 Other intervertebral disc degeneration, lumbar region: Secondary | ICD-10-CM | POA: Diagnosis not present

## 2020-11-21 DIAGNOSIS — M5416 Radiculopathy, lumbar region: Secondary | ICD-10-CM | POA: Diagnosis not present

## 2020-11-21 DIAGNOSIS — M9905 Segmental and somatic dysfunction of pelvic region: Secondary | ICD-10-CM | POA: Diagnosis not present

## 2020-11-21 DIAGNOSIS — M9903 Segmental and somatic dysfunction of lumbar region: Secondary | ICD-10-CM | POA: Diagnosis not present

## 2020-11-21 DIAGNOSIS — M5136 Other intervertebral disc degeneration, lumbar region: Secondary | ICD-10-CM | POA: Diagnosis not present

## 2020-11-25 DIAGNOSIS — M5416 Radiculopathy, lumbar region: Secondary | ICD-10-CM | POA: Diagnosis not present

## 2020-11-25 DIAGNOSIS — M9903 Segmental and somatic dysfunction of lumbar region: Secondary | ICD-10-CM | POA: Diagnosis not present

## 2020-11-25 DIAGNOSIS — M9905 Segmental and somatic dysfunction of pelvic region: Secondary | ICD-10-CM | POA: Diagnosis not present

## 2020-11-25 DIAGNOSIS — M5136 Other intervertebral disc degeneration, lumbar region: Secondary | ICD-10-CM | POA: Diagnosis not present

## 2020-11-27 DIAGNOSIS — M5136 Other intervertebral disc degeneration, lumbar region: Secondary | ICD-10-CM | POA: Diagnosis not present

## 2020-11-27 DIAGNOSIS — M9903 Segmental and somatic dysfunction of lumbar region: Secondary | ICD-10-CM | POA: Diagnosis not present

## 2020-11-27 DIAGNOSIS — M5416 Radiculopathy, lumbar region: Secondary | ICD-10-CM | POA: Diagnosis not present

## 2020-11-27 DIAGNOSIS — M9905 Segmental and somatic dysfunction of pelvic region: Secondary | ICD-10-CM | POA: Diagnosis not present

## 2020-11-28 DIAGNOSIS — M9903 Segmental and somatic dysfunction of lumbar region: Secondary | ICD-10-CM | POA: Diagnosis not present

## 2020-11-28 DIAGNOSIS — M5416 Radiculopathy, lumbar region: Secondary | ICD-10-CM | POA: Diagnosis not present

## 2020-11-28 DIAGNOSIS — M5136 Other intervertebral disc degeneration, lumbar region: Secondary | ICD-10-CM | POA: Diagnosis not present

## 2020-11-28 DIAGNOSIS — M9905 Segmental and somatic dysfunction of pelvic region: Secondary | ICD-10-CM | POA: Diagnosis not present

## 2020-12-02 DIAGNOSIS — M9903 Segmental and somatic dysfunction of lumbar region: Secondary | ICD-10-CM | POA: Diagnosis not present

## 2020-12-02 DIAGNOSIS — M9905 Segmental and somatic dysfunction of pelvic region: Secondary | ICD-10-CM | POA: Diagnosis not present

## 2020-12-02 DIAGNOSIS — M5136 Other intervertebral disc degeneration, lumbar region: Secondary | ICD-10-CM | POA: Diagnosis not present

## 2020-12-02 DIAGNOSIS — M5416 Radiculopathy, lumbar region: Secondary | ICD-10-CM | POA: Diagnosis not present

## 2020-12-05 DIAGNOSIS — M5136 Other intervertebral disc degeneration, lumbar region: Secondary | ICD-10-CM | POA: Diagnosis not present

## 2020-12-05 DIAGNOSIS — M9903 Segmental and somatic dysfunction of lumbar region: Secondary | ICD-10-CM | POA: Diagnosis not present

## 2020-12-05 DIAGNOSIS — M9905 Segmental and somatic dysfunction of pelvic region: Secondary | ICD-10-CM | POA: Diagnosis not present

## 2020-12-05 DIAGNOSIS — M5416 Radiculopathy, lumbar region: Secondary | ICD-10-CM | POA: Diagnosis not present

## 2020-12-07 DIAGNOSIS — M9905 Segmental and somatic dysfunction of pelvic region: Secondary | ICD-10-CM | POA: Diagnosis not present

## 2020-12-07 DIAGNOSIS — M9903 Segmental and somatic dysfunction of lumbar region: Secondary | ICD-10-CM | POA: Diagnosis not present

## 2020-12-07 DIAGNOSIS — M5136 Other intervertebral disc degeneration, lumbar region: Secondary | ICD-10-CM | POA: Diagnosis not present

## 2020-12-07 DIAGNOSIS — M5416 Radiculopathy, lumbar region: Secondary | ICD-10-CM | POA: Diagnosis not present

## 2020-12-10 DIAGNOSIS — M9905 Segmental and somatic dysfunction of pelvic region: Secondary | ICD-10-CM | POA: Diagnosis not present

## 2020-12-10 DIAGNOSIS — M5416 Radiculopathy, lumbar region: Secondary | ICD-10-CM | POA: Diagnosis not present

## 2020-12-10 DIAGNOSIS — M9903 Segmental and somatic dysfunction of lumbar region: Secondary | ICD-10-CM | POA: Diagnosis not present

## 2020-12-10 DIAGNOSIS — M5136 Other intervertebral disc degeneration, lumbar region: Secondary | ICD-10-CM | POA: Diagnosis not present

## 2020-12-12 DIAGNOSIS — M5136 Other intervertebral disc degeneration, lumbar region: Secondary | ICD-10-CM | POA: Diagnosis not present

## 2020-12-12 DIAGNOSIS — M9905 Segmental and somatic dysfunction of pelvic region: Secondary | ICD-10-CM | POA: Diagnosis not present

## 2020-12-12 DIAGNOSIS — M9903 Segmental and somatic dysfunction of lumbar region: Secondary | ICD-10-CM | POA: Diagnosis not present

## 2020-12-12 DIAGNOSIS — M5416 Radiculopathy, lumbar region: Secondary | ICD-10-CM | POA: Diagnosis not present

## 2020-12-14 ENCOUNTER — Other Ambulatory Visit: Payer: Self-pay | Admitting: Nurse Practitioner

## 2020-12-19 ENCOUNTER — Encounter: Payer: Self-pay | Admitting: Physician Assistant

## 2020-12-19 ENCOUNTER — Telehealth: Payer: BC Managed Care – PPO | Admitting: Physician Assistant

## 2020-12-19 ENCOUNTER — Telehealth: Payer: Self-pay | Admitting: *Deleted

## 2020-12-19 DIAGNOSIS — U071 COVID-19: Secondary | ICD-10-CM | POA: Diagnosis not present

## 2020-12-19 MED ORDER — BENZONATATE 100 MG PO CAPS: 100.0000 mg | ORAL_CAPSULE | Freq: Three times a day (TID) | ORAL | 0 refills | Status: DC | PRN

## 2020-12-19 NOTE — Progress Notes (Signed)
Ms. Marissa, Baldwin are scheduled for a virtual visit with your provider today.    Just as we do with appointments in the office, we must obtain your consent to participate.  Your consent will be active for this visit and any virtual visit you may have with one of our providers in the next 365 days.    If you have a MyChart account, I can also send a copy of this consent to you electronically.  All virtual visits are billed to your insurance company just like a traditional visit in the office.  As this is a virtual visit, video technology does not allow for your provider to perform a traditional examination.  This may limit your provider's ability to fully assess your condition.  If your provider identifies any concerns that need to be evaluated in person or the need to arrange testing such as labs, EKG, etc, we will make arrangements to do so.    Although advances in technology are sophisticated, we cannot ensure that it will always work on either your end or our end.  If the connection with a video visit is poor, we may have to switch to a telephone visit.  With either a video or telephone visit, we are not always able to ensure that we have a secure connection.   I need to obtain your verbal consent now.   Are you willing to proceed with your visit today?   Marissa Baldwin has provided verbal consent on 12/19/2020 for a virtual visit (video or telephone).   [name], PA-C 12/19/2020  11:29 AM     Virtual Visit via Video   I connected with patient on 12/19/20 at 11:30 AM EST by a video enabled telemedicine application and verified that I am speaking with the correct person using two identifiers.  Location patient: Home Location provider: [place] participating in the virtual visit: Patient, Provider  I discussed the limitations of evaluation and management by telemedicine and the availability of in person appointments. The patient expressed understanding and agreed  to proceed.  Subjective:   HPI:   Patient presents via [place] today complaining of URI symptoms starting this past weekend.  Patient endorses developing a sore throat Sunday night into early Monday morning, followed by body aches, fatigue and cough.  Patient notes multiple coworkers with Covid.  As such she went to get tested, getting a positive result as of yesterday.  Patient denies fever, chills loss of taste or smell.  She denies any nausea vomiting or diarrhea.  Denies stomach upset.  Denies any chest pain.  Has had some mild windedness but only with significant exertion.  Did use her albuterol inhaler this morning which helped significantly.  Has also been taking vitamin D and vitamin C along with Zicam cold remedy.   ROS:   See pertinent positives and negatives per HPI.  Patient Active Problem List   Diagnosis Date Noted  . Depression, major, single episode, in partial remission ([place]) 05/13/2018  . Statin intolerance 05/13/2018  . Plantar fasciitis 05/13/2018  . Chronic rhinosinusitis 11/12/2017  . Asthma 11/12/2017  . Obesity (BMI 30-39.9) 11/12/2017  . Insomnia 05/12/2017  . Type 2 diabetes mellitus with obesity ([place]) 05/12/2017  . Abnormal liver function test 11/16/2016  . Sleep apnea 05/14/2016  . Elevated alkaline phosphatase level 11/16/2015  . Hypertension associated with diabetes ([place]) 11/13/2015  . Hyperlipidemia associated with type 2 diabetes mellitus ([place]) 11/13/2015    Social History  Tobacco Use  . Smoking status: Never Smoker  . Smokeless tobacco: Never Used  Substance Use Topics  . Alcohol use: Yes    Comment: occasional    Current Outpatient Medications:  .  acetaminophen (TYLENOL) 500 MG tablet, Take 500 mg by mouth every 6 (six) hours as needed., Disp: , Rfl:  .  AMBULATORY NON FORMULARY MEDICATION, Medication Name: Please dispense cpap mask of choice and new supplies. DX G47.33, Disp: 1 each, Rfl: 0 .  amLODipine (NORVASC) 5 MG tablet, TAKE 1  TABLET(5 MG) BY MOUTH DAILY, Disp: 90 tablet, Rfl: 1 .  CONTOUR NEXT TEST test strip, CHECK BLOOD SUGAR TWICE DAILY, Disp: 100 strip, Rfl: 4 .  diclofenac sodium (VOLTAREN) 1 % GEL, Apply 4 g topically 4 (four) times daily., Disp: 100 g, Rfl: 2 .  ezetimibe (ZETIA) 10 MG tablet, TAKE 1 TABLET(10 MG) BY MOUTH DAILY, Disp: 90 tablet, Rfl: 3 .  ketoconazole (NIZORAL) 2 % cream, Apply 1 application topically daily., Disp: 15 g, Rfl: 0 .  losartan (COZAAR) 50 MG tablet, TAKE 1 TABLET(50 MG) BY MOUTH DAILY, Disp: 90 tablet, Rfl: 1 .  meloxicam (MOBIC) 15 MG tablet, TAKE 1/2 TO 1 TABLET BY MOUTH ONCE DAILY AS NEEDED, Disp: 30 tablet, Rfl: 2 .  metFORMIN (GLUCOPHAGE-XR) 500 MG 24 hr tablet, TAKE 1 TABLET(500 MG) BY MOUTH DAILY WITH BREAKFAST, Disp: 90 tablet, Rfl: 4 .  ondansetron (ZOFRAN ODT) 4 MG disintegrating tablet, Take 1 tablet (4 mg total) by mouth every 8 (eight) hours as needed for nausea or vomiting., Disp: 20 tablet, Rfl: 0 .  PROAIR HFA 108 (90 Base) MCG/ACT inhaler, Inhale 2 puffs into the lungs every 6 (six) hours as needed for wheezing or shortness of breath., Disp: 18 g, Rfl: 6 .  QUEtiapine (SEROQUEL) 25 MG tablet, Take 1 tablet (25 mg total) by mouth at bedtime., Disp: 30 tablet, Rfl: 2 .  venlafaxine XR (EFFEXOR-XR) 150 MG 24 hr capsule, Take 1 capsule (150 mg total) by mouth daily with breakfast., Disp: 90 capsule, Rfl: 1 .  venlafaxine XR (EFFEXOR-XR) 75 MG 24 hr capsule, Take 1 capsule (75 mg total) by mouth daily with breakfast. Take with your 150 MG tablet to = 225 MG daily., Disp: 90 capsule, Rfl: 4  Allergies  Allergen Reactions  . Meloxicam Other (See Comments)    GI upset and pain  . Statins Other (See Comments)    Muscle swelling  . Sulfa Antibiotics Swelling    Mouth swelling    Objective:   There were no vitals taken for this visit.  Patient is well-developed, well-nourished in no acute distress.  Resting comfortably at home.  Head is normocephalic, atraumatic.   No labored breathing.  Speech is clear and coherent with logical content.  Patient is alert and oriented at baseline.   Assessment and Plan:   1. COVID-19 Patient tested positive for COVID-19.  Has not had her full vaccination series.  Mild symptoms with no alarm signs or symptoms present.  Enroll patient in COVID symptom monitoring program.  Also sent referral to Covid treatment center given patient's history of diabetes, hypertension and obesity as she would be considered at higher risk, especially giving her vaccine status.  May be a candidate for monoclonal antibody infusion or antiviral treatment.  We will have her continue her vitamin D, vitamin C and zinc.  Continue hydration and rest.  Start Tessalon to help with cough.  Recommend Mucinex OTC.  Other supportive measures and OTC medications  reviewed.  Strict ER precautions reviewed with patient who voiced understanding and agreement with the plan. - benzonatate (TESSALON) 100 MG capsule; Take 1 capsule (100 mg total) by mouth 3 (three) times daily as needed for cough.  Dispense: 30 capsule; Refill: 0 - Ambulatory referral for Covid Treatment    [name], PA-C 12/19/2020

## 2020-12-19 NOTE — Telephone Encounter (Signed)
Attempted to call patient- busy signal : survey reporting worse symptoms: cough, appetite. Patient did have virtual visit today with provider and has received Rx for symptoms and referral for infusion treatment. Will message patient in [place]- with current recommendations

## 2020-12-19 NOTE — Patient Instructions (Signed)
Instructions sent to patients MyChart.

## 2020-12-20 ENCOUNTER — Telehealth: Payer: Self-pay | Admitting: Nurse Practitioner

## 2020-12-20 ENCOUNTER — Telehealth: Payer: Self-pay | Admitting: Unknown Physician Specialty

## 2020-12-20 ENCOUNTER — Telehealth: Payer: Self-pay | Admitting: *Deleted

## 2020-12-20 ENCOUNTER — Other Ambulatory Visit: Payer: Self-pay | Admitting: Unknown Physician Specialty

## 2020-12-20 ENCOUNTER — Other Ambulatory Visit (HOSPITAL_COMMUNITY): Payer: Self-pay | Admitting: Unknown Physician Specialty

## 2020-12-20 ENCOUNTER — Telehealth: Payer: Self-pay

## 2020-12-20 MED ORDER — NIRMATRELVIR/RITONAVIR (PAXLOVID)TABLET: 3.0000 | ORAL_TABLET | Freq: Two times a day (BID) | ORAL | 0 refills | Status: DC

## 2020-12-20 MED ORDER — NIRMATRELVIR/RITONAVIR (PAXLOVID)TABLET: 3.0000 | ORAL_TABLET | Freq: Two times a day (BID) | ORAL | 0 refills | Status: AC

## 2020-12-20 NOTE — Telephone Encounter (Signed)
Patient returned call-she states she has chest congestion with cough but does not seen to be breaking up and moving out. Patient did have virtual visit and was prescribed cough medication- but she is afraid to take OTC congestion medications due to her BP- advised patient I would send message to provider to get advise on what medications it would be best for her to take. She has been contacted by infusion clinic and is awaiting a call back.

## 2020-12-20 NOTE — Telephone Encounter (Signed)
Left detailed message for patient to make aware of Dr.Jolene Cannady recommendations. Advised patient to give our office a call back Monday at her earliest convenience due to our office closing at 5pm to get scheduled for follow up and lung check appointment.

## 2020-12-20 NOTE — Telephone Encounter (Signed)
Please advise pt had telehealth appt yesterday

## 2020-12-20 NOTE — Telephone Encounter (Signed)
Outpatient Oral COVID Treatment Note  I connected with Marissa Baldwin on 12/20/2020/3:49 PM by telephone and verified that I am speaking with the correct person using two identifiers.  I discussed the limitations, risks, security, and privacy concerns of performing an evaluation and management service by telephone and the availability of in person appointments. I also discussed with the patient that there may be a patient responsible charge related to this service. The patient expressed understanding and agreed to proceed.  Patient location: home Provider location: home  Diagnosis: COVID-19 infection  Purpose of visit: Discussion of potential use of Molnupiravir or Paxlovid, a new treatment for mild to moderate COVID-19 viral infection in non-hospitalized patients.   Subjective: Patient is a 55 y.o. female who has been diagnosed with COVID 19 viral infection.  Their symptoms began on 3/1 with fever.    Past Medical History:  Diagnosis Date  . Anxiety   . Arthritis 11/19   Hands hip shoulders  . BPPV (benign paroxysmal positional vertigo)   . Depression 04/2018  . Golfers elbow of left upper extremity   . Hyperlipidemia   . Hypertension   . Kidney stone   . Macular degeneration   . Morton's neuroma of right foot   . Postmenopausal symptoms   . Sleep apnea    CPAP    Allergies  Allergen Reactions  . Meloxicam Other (See Comments)    GI upset and pain  . Statins Other (See Comments)    Muscle swelling  . Sulfa Antibiotics Swelling    Mouth swelling     Current Outpatient Medications:  .  AMBULATORY NON FORMULARY MEDICATION, Medication Name: Please dispense cpap mask of choice and new supplies. DX G47.33, Disp: 1 each, Rfl: 0 .  amLODipine (NORVASC) 5 MG tablet, TAKE 1 TABLET(5 MG) BY MOUTH DAILY, Disp: 90 tablet, Rfl: 1 .  benzonatate (TESSALON) 100 MG capsule, Take 1 capsule (100 mg total) by mouth 3 (three) times daily as needed for cough., Disp: 30 capsule, Rfl: 0 .  CONTOUR  NEXT TEST test strip, CHECK BLOOD SUGAR TWICE DAILY, Disp: 100 strip, Rfl: 4 .  diclofenac sodium (VOLTAREN) 1 % GEL, Apply 4 g topically 4 (four) times daily., Disp: 100 g, Rfl: 2 .  ezetimibe (ZETIA) 10 MG tablet, TAKE 1 TABLET(10 MG) BY MOUTH DAILY, Disp: 90 tablet, Rfl: 3 .  ketoconazole (NIZORAL) 2 % cream, Apply 1 application topically daily., Disp: 15 g, Rfl: 0 .  losartan (COZAAR) 50 MG tablet, TAKE 1 TABLET(50 MG) BY MOUTH DAILY, Disp: 90 tablet, Rfl: 1 .  meloxicam (MOBIC) 15 MG tablet, TAKE 1/2 TO 1 TABLET BY MOUTH ONCE DAILY AS NEEDED, Disp: 30 tablet, Rfl: 2 .  metFORMIN (GLUCOPHAGE-XR) 500 MG 24 hr tablet, TAKE 1 TABLET(500 MG) BY MOUTH DAILY WITH BREAKFAST, Disp: 90 tablet, Rfl: 4 .  ondansetron (ZOFRAN ODT) 4 MG disintegrating tablet, Take 1 tablet (4 mg total) by mouth every 8 (eight) hours as needed for nausea or vomiting., Disp: 20 tablet, Rfl: 0 .  PROAIR HFA 108 (90 Base) MCG/ACT inhaler, Inhale 2 puffs into the lungs every 6 (six) hours as needed for wheezing or shortness of breath., Disp: 18 g, Rfl: 6 .  venlafaxine XR (EFFEXOR-XR) 150 MG 24 hr capsule, Take 1 capsule (150 mg total) by mouth daily with breakfast., Disp: 90 capsule, Rfl: 1 .  venlafaxine XR (EFFEXOR-XR) 75 MG 24 hr capsule, Take 1 capsule (75 mg total) by mouth daily with breakfast. Take with your  150 MG tablet to = 225 MG daily., Disp: 90 capsule, Rfl: 4  Objective: Patient appears/sounds well.  They are in no apparent distress.  Breathing is non labored.  Mood and behavior are normal.  Laboratory Data:  No results found for this or any previous visit (from the past 2160 hour(s)).   Assessment: 55 y.o. female with mild/moderate COVID 19 viral infection diagnosed on 3/1 at high risk for progression to severe COVID 19.  Plan:  This patient is a 55 y.o. female that meets the following criteria for Emergency Use Authorization of: Paxlovid 1. Age >12 yr AND > 40 kg 2. SARS-COV-2 positive test 3. Symptom onset  < 5 days 4. Mild-to-moderate COVID disease with high risk for severe progression to hospitalization or death  I have spoken and communicated the following to the patient or parent/caregiver regarding: 1. Paxlovid is an unapproved drug that is authorized for use under an Emergency Use Authorization.  2. There are no adequate, approved, available products for the treatment of COVID-19 in adults who have mild-to-moderate COVID-19 and are at high risk for progressing to severe COVID-19, including hospitalization or death. 3. Other therapeutics are currently authorized. For additional information on all products authorized for treatment or prevention of COVID-19, please see [email or web address].  4. There are benefits and risks of taking this treatment as outlined in the "Fact Sheet for Patients and Caregivers."  5. "Fact Sheet for Patients and Caregivers" was reviewed with patient. A hard copy will be provided to patient from pharmacy prior to the patient receiving treatment. 6. Patients should continue to self-isolate and use infection control measures (e.g., wear mask, isolate, social distance, avoid sharing personal items, clean and disinfect "high touch" surfaces, and frequent handwashing) according to CDC guidelines.  7. The patient or parent/caregiver has the option to accept or refuse treatment. 8. Patient medication history was reviewed for potential drug interactions:Interaction with home meds: Amlodipine 9. Patient's GFR was calculated to be >60, and they were therefore prescribed Normal dose (GFR>60) - nirmatrelvir 150mg  tab (2 tablet) by mouth twice daily AND ritonavir 100mg  tab (1 tablet) by mouth twice daily   After reviewing above information with the patient, the patient agrees to receive Paxlovid.  Follow up instructions:    . Take prescription BID x 5 days as directed . Reach out  to pharmacist for counseling on medication if desired . For concerns regarding further COVID symptoms please follow up with your PCP or urgent care . For urgent or life-threatening issues, seek care at your local emergency department  The patient was provided an opportunity to ask questions, and all were answered. The patient agreed with the plan and demonstrated an understanding of the instructions.   Script sent to [place] and opted to pick up RX.  The patient was advised to call their PCP or seek an in-person evaluation if the symptoms worsen or if the condition fails to improve as anticipated.   I provided 20 minutes of non face-to-face telephone visit time during this encounter, and > 50% was spent counseling as documented under my assessment & plan.  Kathrine Haddock, NP 12/20/2020 /3:49 PM

## 2020-12-20 NOTE — Telephone Encounter (Signed)
Infusion center was able to get a hold of patient, Marissa Baldwin, patient is going to get Paxlovid for treatment.  At this time I would recommend for cough/congestion that she can Coricidin -- this is a medication that is okay for those with higher BP to use as will not elevate the BP.  If any worsening of symptoms immediately go to ER for further evaluation.  Would like to see her in office in 2 weeks for follow-up and lung check.

## 2020-12-20 NOTE — Telephone Encounter (Signed)
Wonderful, thank you

## 2020-12-20 NOTE — Telephone Encounter (Signed)
Patient was prescribed oral covid treatment Paxlovid and treatment note was reviewed. Medication has been received by [place] and reviewed for appropriateness.  Drug Interactions or Dosage Adjustments Noted: Patient was instructed to stop amlodipine while taking Paxlovid.  Delivery Method: Pick-up  Patient contacted for counseling on 12/20/20 and verbalized understanding.   Delivery or Pick-Up Date: 12/20/20   Carolynne Edouard 12/20/2020, 4:37 PM [facility] Out[facility] Pharmacist Phone# [phone]

## 2020-12-20 NOTE — Telephone Encounter (Signed)
Called to Discuss with patient about Covid symptoms and the use of the monoclonal antibody/antiviral infusion/oral therapies for those with mild to moderate Covid symptoms and at a high risk of hospitalization.     Pt appears to qualify for this infusion due to co-morbid conditions and/or a member of an at-risk group in accordance with the FDA Emergency Use Authorization.   Unable to reach pt Voicemail left and My Chart message sent.   Symptom onset: 12/18/2020 Vaccinated: x2  Booster: No  Immunocompromised: No  Qualifiers: BMI, htn, dm   Alda Lea, NP [facility]  [phone]

## 2020-12-20 NOTE — Telephone Encounter (Signed)
2nd call - Called to discuss with patient about COVID-19 symptoms and the use of one of the available treatments for those with mild to moderate Covid symptoms and at a high risk of hospitalization.  Pt appears to qualify for outpatient treatment due to co-morbid conditions and/or a member of an at-risk group in accordance with the FDA Emergency Use Authorization.     Unable to reach pt - No answer  Kathrine Haddock

## 2020-12-20 NOTE — Telephone Encounter (Signed)
Thank you Malachy Mood, she sent telephone message today and reported in message she was waiting call back from infusion center.

## 2020-12-23 ENCOUNTER — Telehealth: Payer: Self-pay

## 2020-12-23 NOTE — Telephone Encounter (Signed)
Lvm to make apt.  

## 2020-12-23 NOTE — Telephone Encounter (Signed)
Routing to [facility].

## 2020-12-23 NOTE — Telephone Encounter (Signed)
.   After numerous attempts to reach pt unsuccessfully, sent pt instruction to call 911 Shortness of breath is the same:   continue to monitor at home  If symptoms become severe, i.e. shortness of breath at rest, gasping for air, wheezing, call 911 and seek treatment in the ED

## 2020-12-23 NOTE — Telephone Encounter (Signed)
Sent pt a message on MyChart to call back [phone] and ask for me.  Unable to reach pt on her

## 2020-12-23 NOTE — Telephone Encounter (Signed)
Pt schduled for 01/09/2021 pt verbalized understanding.

## 2020-12-23 NOTE — Telephone Encounter (Signed)
Lvm  to see what was going on pt did cancel apt on 12/24/2020

## 2020-12-23 NOTE — Telephone Encounter (Signed)
Lvm to make this apt. 

## 2020-12-23 NOTE — Telephone Encounter (Signed)
Appt scheduled

## 2020-12-23 NOTE — Telephone Encounter (Signed)
Marissa Baldwin was able to get through to her for Covid treatment, but she will still need follow-up as soon as possible for lung check.

## 2020-12-24 ENCOUNTER — Ambulatory Visit: Payer: BC Managed Care – PPO | Admitting: Nurse Practitioner

## 2020-12-27 ENCOUNTER — Other Ambulatory Visit: Payer: Self-pay | Admitting: Nurse Practitioner

## 2021-01-04 ENCOUNTER — Encounter: Payer: Self-pay | Admitting: Nurse Practitioner

## 2021-01-04 DIAGNOSIS — Z8616 Personal history of COVID-19: Secondary | ICD-10-CM | POA: Insufficient documentation

## 2021-01-09 ENCOUNTER — Ambulatory Visit: Payer: BC Managed Care – PPO | Admitting: Nurse Practitioner

## 2021-01-09 ENCOUNTER — Other Ambulatory Visit: Payer: Self-pay

## 2021-01-09 ENCOUNTER — Encounter: Payer: Self-pay | Admitting: Nurse Practitioner

## 2021-01-09 VITALS — BP 124/85 | HR 73 | Temp 99.1°F | Wt 160.0 lb

## 2021-01-09 DIAGNOSIS — E785 Hyperlipidemia, unspecified: Secondary | ICD-10-CM

## 2021-01-09 DIAGNOSIS — E1159 Type 2 diabetes mellitus with other circulatory complications: Secondary | ICD-10-CM | POA: Diagnosis not present

## 2021-01-09 DIAGNOSIS — E1169 Type 2 diabetes mellitus with other specified complication: Secondary | ICD-10-CM

## 2021-01-09 DIAGNOSIS — F324 Major depressive disorder, single episode, in partial remission: Secondary | ICD-10-CM | POA: Diagnosis not present

## 2021-01-09 DIAGNOSIS — F5101 Primary insomnia: Secondary | ICD-10-CM

## 2021-01-09 DIAGNOSIS — E669 Obesity, unspecified: Secondary | ICD-10-CM

## 2021-01-09 DIAGNOSIS — Z8616 Personal history of COVID-19: Secondary | ICD-10-CM

## 2021-01-09 DIAGNOSIS — M791 Myalgia, unspecified site: Secondary | ICD-10-CM

## 2021-01-09 DIAGNOSIS — I152 Hypertension secondary to endocrine disorders: Secondary | ICD-10-CM | POA: Diagnosis not present

## 2021-01-09 DIAGNOSIS — T466X5A Adverse effect of antihyperlipidemic and antiarteriosclerotic drugs, initial encounter: Secondary | ICD-10-CM

## 2021-01-09 DIAGNOSIS — G4733 Obstructive sleep apnea (adult) (pediatric): Secondary | ICD-10-CM

## 2021-01-09 LAB — MICROALBUMIN, URINE WAIVED
Creatinine, Urine Waived: 50 mg/dL (ref 10–300)
Microalb, Ur Waived: 30 mg/L — ABNORMAL HIGH (ref 0–19)

## 2021-01-09 LAB — BAYER DCA HB A1C WAIVED: HB A1C (BAYER DCA - WAIVED): 6.8 % (ref <7.0)

## 2021-01-09 MED ORDER — BUSPIRONE HCL 5 MG PO TABS: 5.0000 mg | ORAL_TABLET | Freq: Two times a day (BID) | ORAL | 3 refills | Status: AC

## 2021-01-09 NOTE — Progress Notes (Signed)
BP 124/85 (BP Location: Left Arm, Cuff Size: Normal)   Pulse 73   Temp 99.1 F (37.3 C) (Oral)   Wt 160 lb (72.6 kg)   SpO2 98%   BMI 32.32 kg/m    Subjective:    Patient ID: Marissa Baldwin, female    DOB: 1966/10/07, 55 y.o.   MRN: [id number]  HPI: Marissa Baldwin is a 55 y.o. female  Chief Complaint  Patient presents with  . Depression  . Diabetes    Pt states she has not had a recent eye exam, knows she is due   . Hyperlipidemia  . Hypertension   DIABETES Continues on Metformin.  Last A1C was 6.5% in September 2021.  Has recent history of Covid on 12/18/20 and received oral Paxlovid for treatment.  Overall reports improvement in cough, but does endorse ongoing fatigue.   Hypoglycemic episodes:no Polydipsia/polyuria: no Visual disturbance: no Chest pain: no Paresthesias: no Glucose Monitoring: yes  Accucheck frequency: Daily  Fasting glucose: 130-140  Post prandial:  Evening: 118 after work yesterday  Before meals: Taking Insulin?: no  Long acting insulin:  Short acting insulin: Blood Pressure Monitoring: rarely Retinal Examination: Up to Date Foot Exam: Up to Date Pneumovax: Up to Date Influenza: refuses Aspirin: no   HYPERTENSION / HYPERLIPIDEMIA Continues on Losartan 50 MG daily, Zetia, and Amlodipine.  Uses CPAP, but not lately since Covid as nose congestion.   Satisfied with current treatment? yes Duration of hypertension: chronic BP monitoring frequency: rarely BP range:  BP medication side effects: no Duration of hyperlipidemia: chronic Cholesterol medication side effects: no Cholesterol supplements: none Medication compliance: good compliance Aspirin: no Recent stressors: no Recurrent headaches: no Visual changes: no Palpitations: no Dyspnea: no Chest pain: no Lower extremity edema: no Dizzy/lightheaded: no   DEPRESSION Continues on Seroquel 25 MG daily (takes 1/2 of this and not often as side effects) and Effexor XR 225 MG daily -- continues  to get anxiety and aggravated at times. Mood status: controlled Satisfied with current treatment?: yes Symptom severity: moderate  Duration of current treatment : chronic Side effects: no Medication compliance: good compliance Psychotherapy/counseling: none Depressed mood: yes Anxious mood: yes Anhedonia: no Significant weight loss or gain: no Insomnia: yes hard to fall asleep Fatigue: no Feelings of worthlessness or guilt: no Impaired concentration/indecisiveness: no Suicidal ideations: no Hopelessness: no Crying spells: no Depression screen [facility] 2/9 01/09/2021 06/26/2020 03/20/2020 12/21/2019 05/16/2019  Decreased Interest 1 1 0 0 0  Down, Depressed, Hopeless 1 1 2  0 1  PHQ - 2 Score 2 2 2  0 1  Altered sleeping 3 1 1 1 3   Tired, decreased energy 1 3 3 2  0  Change in appetite 0 3 2 1  0  Feeling bad or failure about yourself  0 3 0 0 1  Trouble concentrating 0 2 2 1 3   Moving slowly or fidgety/restless 0 1 1 0 1  Suicidal thoughts 0 0 0 0 0  PHQ-9 Score 6 15 11 5 9   Difficult doing work/chores Not difficult at all - Somewhat difficult - Not difficult at all  Some recent data might be hidden   GAD 7 : Generalized Anxiety Score 01/09/2021 12/21/2019  Nervous, Anxious, on Edge 2 2  Control/stop worrying 2 1  Worry too much - different things 2 1  Trouble relaxing 2 1  Restless 2 0  Easily annoyed or irritable 2 1  Afraid - awful might happen 2 0  Total GAD 7 Score 14  6  Anxiety Difficulty Somewhat difficult Somewhat difficult    Relevant past medical, surgical, family and social history reviewed and updated as indicated. Interim medical history since our last visit reviewed. Allergies and medications reviewed and updated.  Review of Systems  Constitutional: Negative for activity change, appetite change, diaphoresis, fatigue and fever.  Respiratory: Negative for cough, chest tightness and shortness of breath.   Cardiovascular: Negative for chest pain, palpitations and leg  swelling.  Gastrointestinal: Negative.   Endocrine: Negative for polydipsia, polyphagia and polyuria.  Neurological: Negative.   Psychiatric/Behavioral: Positive for decreased concentration and sleep disturbance. Negative for self-injury and suicidal ideas. The patient is nervous/anxious.     Per HPI unless specifically indicated above     Objective:    BP 124/85 (BP Location: Left Arm, Cuff Size: Normal)   Pulse 73   Temp 99.1 F (37.3 C) (Oral)   Wt 160 lb (72.6 kg)   SpO2 98%   BMI 32.32 kg/m   Wt Readings from Last 3 Encounters:  01/09/21 160 lb (72.6 kg)  09/11/20 163 lb (73.9 kg)  08/26/20 164 lb 3.2 oz (74.5 kg)    Physical Exam Vitals and nursing note reviewed.  Constitutional:      General: She is awake. She is not in acute distress.    Appearance: She is well-developed and well-groomed. She is obese. She is not ill-appearing.  HENT:     Head: Normocephalic.     Right Ear: Hearing normal.     Left Ear: Hearing normal.     Nose: Nose normal.  Eyes:     General: Lids are normal.        Right eye: No discharge.        Left eye: No discharge.     Conjunctiva/sclera: Conjunctivae normal.     Pupils: Pupils are equal, round, and reactive to light.  Neck:     Thyroid: No thyromegaly.     Vascular: No carotid bruit.  Cardiovascular:     Rate and Rhythm: Normal rate and regular rhythm.     Heart sounds: Normal heart sounds. No murmur heard. No gallop.   Pulmonary:     Effort: Pulmonary effort is normal. No accessory muscle usage or respiratory distress.     Breath sounds: Normal breath sounds.  Abdominal:     General: Bowel sounds are normal.     Palpations: Abdomen is soft. There is no hepatomegaly or splenomegaly.  Musculoskeletal:     Cervical back: Normal range of motion and neck supple.     Right lower leg: No edema.     Left lower leg: No edema.  Skin:    General: Skin is warm and dry.  Neurological:     Mental Status: She is alert and oriented to  person, place, and time.  Psychiatric:        Attention and Perception: Attention normal.        Mood and Affect: Mood normal.        Speech: Speech normal.        Behavior: Behavior normal.        Thought Content: Thought content normal.    Diabetic Foot Exam - Simple   Simple Foot Form Visual Inspection No deformities, no ulcerations, no other skin breakdown bilaterally: Yes Sensation Testing Intact to touch and monofilament testing bilaterally: Yes Pulse Check Posterior Tibialis and Dorsalis pulse intact bilaterally: Yes Comments    Results for orders placed or performed in visit on 01/09/21  Bayer DCA Hb A1c Waived  Result Value Ref Range   HB A1C (BAYER DCA - WAIVED) 6.8 <7.0 %  Microalbumin, Urine Waived  Result Value Ref Range   Microalb, Ur Waived 30 (H) 0 - 19 mg/L   Creatinine, Urine Waived 50 10 - 300 mg/dL   Microalb/Creat Ratio 30-300 (H) <30 mg/g      Assessment & Plan:   Problem List Items Addressed This Visit      Cardiovascular and Mediastinum   Hypertension associated with diabetes (HCC)    Chronic, ongoing.  BP at goal today.  Continue current medication regimen and adjust as needed.  Recommend she check BP at least 3 days a week at home and document for provider + focus on DASH diet. Losartan for kidney protection with diabetes.  CMP and TSH today.  Return in 6 months.       Relevant Orders   Bayer DCA Hb A1c Waived (Completed)   Microalbumin, Urine Waived (Completed)   TSH   Comprehensive metabolic panel     Respiratory   Sleep apnea    Recommend consistent use of CPAP at home.        Endocrine   Hyperlipidemia associated with type 2 diabetes mellitus (HCC)    Chronic, ongoing.  Continue current medication regimen and adjust as needed -- Zetia as is statin intolerant.  Lipid panel today.  Could consider Repatha in future if LDL not meeting goal.       Relevant Orders   Bayer DCA Hb A1c Waived (Completed)   Lipid Panel w/o Chol/HDL  Ratio   Type 2 diabetes mellitus with obesity (HCC) - Primary    Chronic, stable A1C.  Today A1C 6.8%, praised for continued good control, urine ALB 30 and A:C 30-300.  Continue current medication regimen and adjust as needed.  Recommend she monitor BS at home BID and document for visits.  Focus on diabetic diet.  Continue Losartan for kidney protection.  Return in 6 months.      Relevant Orders   Bayer DCA Hb A1c Waived (Completed)   Microalbumin, Urine Waived (Completed)     Other   Insomnia    Chronic, stable, not taking Seroquel often.  Recommend use of Melatonin as needed, 10 MG.      Obesity (BMI 30-39.9)    Recommended eating smaller high protein, low fat meals more frequently and exercising 30 mins a day 5 times a week with a goal of 10-15lb weight loss in the next 3 months. Patient voiced their understanding and motivation to adhere to these recommendations.       Depression, major, single episode, in partial remission (HCC)    Chronic, stable.  Denies SI/HI.  Will continue Effexor 225 MG and add on Buspar 5 MG BID which may better benefit mood/anxiety.  Monitor BP closely with Efexor and change to alternate regimen as needed. Continue Seroquel at current dose, but recommend minimal use of this.  Plan on follow-up in 6 months.        Relevant Medications   busPIRone (BUSPAR) 5 MG tablet   Myalgia due to statin    Refer to hyperlipidemia plan, continue Zetia -- may benefit from Agar in future.      History of 2019 novel coronavirus disease (COVID-19)    Acute and improving symptoms.  Continue to monitor and check labs today.            Follow up plan: Return in about 6 months (around 07/12/2021)  for T2DM, HTN/HLD, MOOD.

## 2021-01-09 NOTE — Assessment & Plan Note (Signed)
Chronic, ongoing.  Continue current medication regimen and adjust as needed -- Zetia as is statin intolerant.  Lipid panel today.  Could consider Repatha in future if LDL not meeting goal.

## 2021-01-09 NOTE — Assessment & Plan Note (Signed)
Chronic, stable.  Denies SI/HI.  Will continue Effexor 225 MG and add on Buspar 5 MG BID which may better benefit mood/anxiety.  Monitor BP closely with Efexor and change to alternate regimen as needed. Continue Seroquel at current dose, but recommend minimal use of this.  Plan on follow-up in 6 months.

## 2021-01-09 NOTE — Assessment & Plan Note (Signed)
Recommend consistent use of CPAP at home.

## 2021-01-09 NOTE — Patient Instructions (Signed)
Diabetes Mellitus and Nutrition, Adult When you have diabetes, or diabetes mellitus, it is very important to have healthy eating habits because your blood sugar (glucose) levels are greatly affected by what you eat and drink. Eating healthy foods in the right amounts, at about the same times every day, can help you:  Control your blood glucose.  Lower your risk of heart disease.  Improve your blood pressure.  Reach or maintain a healthy weight. What can affect my meal plan? Every person with diabetes is different, and each person has different needs for a meal plan. Your health care provider may recommend that you work with a dietitian to make a meal plan that is best for you. Your meal plan may vary depending on factors such as:  The calories you need.  The medicines you take.  Your weight.  Your blood glucose, blood pressure, and cholesterol levels.  Your activity level.  Other health conditions you have, such as heart or kidney disease. How do carbohydrates affect me? Carbohydrates, also called carbs, affect your blood glucose level more than any other type of food. Eating carbs naturally raises the amount of glucose in your blood. Carb counting is a method for keeping track of how many carbs you eat. Counting carbs is important to keep your blood glucose at a healthy level, especially if you use insulin or take certain oral diabetes medicines. It is important to know how many carbs you can safely have in each meal. This is different for every person. Your dietitian can help you calculate how many carbs you should have at each meal and for each snack. How does alcohol affect me? Alcohol can cause a sudden decrease in blood glucose (hypoglycemia), especially if you use insulin or take certain oral diabetes medicines. Hypoglycemia can be a life-threatening condition. Symptoms of hypoglycemia, such as sleepiness, dizziness, and confusion, are similar to symptoms of having too much  alcohol.  Do not drink alcohol if: ? Your health care provider tells you not to drink. ? You are pregnant, may be pregnant, or are planning to become pregnant.  If you drink alcohol: ? Do not drink on an empty stomach. ? Limit how much you use to:  0-1 drink a day for women.  0-2 drinks a day for men. ? Be aware of how much alcohol is in your drink. In the [place] one drink equals one 12 oz bottle of beer (355 mL), one 5 oz glass of wine (148 mL), or one 1 oz glass of hard liquor (44 mL). ? Keep yourself hydrated with water, diet soda, or unsweetened iced tea.  Keep in mind that regular soda, juice, and other mixers may contain a lot of sugar and must be counted as carbs. What are tips for following this plan? Reading food labels  Start by checking the serving size on the "Nutrition Facts" label of packaged foods and drinks. The amount of calories, carbs, fats, and other nutrients listed on the label is based on one serving of the item. Many items contain more than one serving per package.  Check the total grams (g) of carbs in one serving. You can calculate the number of servings of carbs in one serving by dividing the total carbs by 15. For example, if a food has 30 g of total carbs per serving, it would be equal to 2 servings of carbs.  Check the number of grams (g) of saturated fats and trans fats in one serving. Choose foods that have   a low amount or none of these fats.  Check the number of milligrams (mg) of salt (sodium) in one serving. Most people should limit total sodium intake to less than 2,300 mg per day.  Always check the nutrition information of foods labeled as "low-fat" or "nonfat." These foods may be higher in added sugar or refined carbs and should be avoided.  Talk to your dietitian to identify your daily goals for nutrients listed on the label. Shopping  Avoid buying canned, pre-made, or processed foods. These foods tend to be high in fat, sodium, and added  sugar.  Shop around the outside edge of the grocery store. This is where you will most often find fresh fruits and vegetables, bulk grains, fresh meats, and fresh dairy. Cooking  Use low-heat cooking methods, such as baking, instead of high-heat cooking methods like deep frying.  Cook using healthy oils, such as olive, canola, or sunflower oil.  Avoid cooking with butter, cream, or high-fat meats. Meal planning  Eat meals and snacks regularly, preferably at the same times every day. Avoid going long periods of time without eating.  Eat foods that are high in fiber, such as fresh fruits, vegetables, beans, and whole grains. Talk with your dietitian about how many servings of carbs you can eat at each meal.  Eat 4-6 oz (112-168 g) of lean protein each day, such as lean meat, chicken, fish, eggs, or tofu. One ounce (oz) of lean protein is equal to: ? 1 oz (28 g) of meat, chicken, or fish. ? 1 egg. ?  cup (62 g) of tofu.  Eat some foods each day that contain healthy fats, such as avocado, nuts, seeds, and fish.   What foods should I eat? Fruits Berries. Apples. Oranges. Peaches. Apricots. Plums. Grapes. Mango. Papaya. Pomegranate. Kiwi. Cherries. Vegetables Lettuce. Spinach. Leafy greens, including kale, chard, collard greens, and mustard greens. Beets. Cauliflower. Cabbage. Broccoli. Carrots. Green beans. Tomatoes. Peppers. Onions. Cucumbers. Brussels sprouts. Grains Whole grains, such as whole-wheat or whole-grain bread, crackers, tortillas, cereal, and pasta. Unsweetened oatmeal. Quinoa. Brown or wild rice. Meats and other proteins Seafood. Poultry without skin. Lean cuts of poultry and beef. Tofu. Nuts. Seeds. Dairy Low-fat or fat-free dairy products such as milk, yogurt, and cheese. The items listed above may not be a complete list of foods and beverages you can eat. Contact a dietitian for more information. What foods should I avoid? Fruits Fruits canned with  syrup. Vegetables Canned vegetables. Frozen vegetables with butter or cream sauce. Grains Refined white flour and flour products such as bread, pasta, snack foods, and cereals. Avoid all processed foods. Meats and other proteins Fatty cuts of meat. Poultry with skin. Breaded or fried meats. Processed meat. Avoid saturated fats. Dairy Full-fat yogurt, cheese, or milk. Beverages Sweetened drinks, such as soda or iced tea. The items listed above may not be a complete list of foods and beverages you should avoid. Contact a dietitian for more information. Questions to ask a health care provider  Do I need to meet with a diabetes educator?  Do I need to meet with a dietitian?  What number can I call if I have questions?  When are the best times to check my blood glucose? Where to find more information:  American Diabetes Association: diabetes.org  Academy of Nutrition and Dietetics: www.eatright.org  National Institute of Diabetes and Digestive and Kidney Diseases: www.niddk.nih.gov  Association of Diabetes Care and Education Specialists: www.diabeteseducator.org Summary  It is important to have healthy eating   habits because your blood sugar (glucose) levels are greatly affected by what you eat and drink.  A healthy meal plan will help you control your blood glucose and maintain a healthy lifestyle.  Your health care provider may recommend that you work with a dietitian to make a meal plan that is best for you.  Keep in mind that carbohydrates (carbs) and alcohol have immediate effects on your blood glucose levels. It is important to count carbs and to use alcohol carefully. This information is not intended to replace advice given to you by your health care provider. Make sure you discuss any questions you have with your health care provider. Document Revised: 09/12/2019 Document Reviewed: 09/12/2019 Elsevier Patient Education  2021 Elsevier Inc.  

## 2021-01-09 NOTE — Assessment & Plan Note (Signed)
Refer to hyperlipidemia plan, continue Zetia -- may benefit from [place] in future.

## 2021-01-09 NOTE — Assessment & Plan Note (Signed)
Recommended eating smaller high protein, low fat meals more frequently and exercising 30 mins a day 5 times a week with a goal of 10-15lb weight loss in the next 3 months. Patient voiced their understanding and motivation to adhere to these recommendations.  

## 2021-01-09 NOTE — Assessment & Plan Note (Signed)
Acute and improving symptoms.  Continue to monitor and check labs today.

## 2021-01-09 NOTE — Assessment & Plan Note (Signed)
Chronic, ongoing.  BP at goal today.  Continue current medication regimen and adjust as needed.  Recommend she check BP at least 3 days a week at home and document for provider + focus on DASH diet. Losartan for kidney protection with diabetes.  CMP and TSH today.  Return in 6 months.

## 2021-01-09 NOTE — Assessment & Plan Note (Signed)
Chronic, stable A1C.  Today A1C 6.8%, praised for continued good control, urine ALB 30 and A:C 30-300.  Continue current medication regimen and adjust as needed.  Recommend she monitor BS at home BID and document for visits.  Focus on diabetic diet.  Continue Losartan for kidney protection.  Return in 6 months.

## 2021-01-09 NOTE — Assessment & Plan Note (Addendum)
Chronic, stable, not taking Seroquel often.  Recommend use of Melatonin as needed, 10 MG.

## 2021-01-10 LAB — COMPREHENSIVE METABOLIC PANEL
ALT: 60 IU/L — ABNORMAL HIGH (ref 0–32)
AST: 39 IU/L (ref 0–40)
Albumin/Globulin Ratio: 1.6 (ref 1.2–2.2)
Albumin: 4.4 g/dL (ref 3.8–4.9)
Alkaline Phosphatase: 117 IU/L (ref 44–121)
BUN/Creatinine Ratio: 31 — ABNORMAL HIGH (ref 9–23)
BUN: 16 mg/dL (ref 6–24)
Bilirubin Total: 0.2 mg/dL (ref 0.0–1.2)
CO2: 23 mmol/L (ref 20–29)
Calcium: 9.2 mg/dL (ref 8.7–10.2)
Chloride: 103 mmol/L (ref 96–106)
Creatinine, Ser: 0.51 mg/dL — ABNORMAL LOW (ref 0.57–1.00)
Globulin, Total: 2.8 g/dL (ref 1.5–4.5)
Glucose: 121 mg/dL — ABNORMAL HIGH (ref 65–99)
Potassium: 4.4 mmol/L (ref 3.5–5.2)
Sodium: 142 mmol/L (ref 134–144)
Total Protein: 7.2 g/dL (ref 6.0–8.5)
eGFR: 111 mL/min/{1.73_m2} (ref >59)

## 2021-01-10 LAB — LIPID PANEL W/O CHOL/HDL RATIO
Cholesterol, Total: 192 mg/dL (ref 100–199)
HDL: 59 mg/dL (ref >39)
LDL Chol Calc (NIH): 117 mg/dL — ABNORMAL HIGH (ref 0–99)
Triglycerides: 91 mg/dL (ref 0–149)
VLDL Cholesterol Cal: 16 mg/dL (ref 5–40)

## 2021-01-10 LAB — TSH: TSH: 0.659 u[IU]/mL (ref 0.450–4.500)

## 2021-01-10 NOTE — Progress Notes (Signed)
Contacted via MyChart   Good evening Marissa Baldwin, your labs have returned.  Kidney (creatinine and eGFR) remains normal.  Liver function (AST and ALT) continues to note some mild elevation in ALT, recommend minimal alcohol or Tylenol use.  Will continue to monitor this.  Thyroid normal.  Cholesterol levels continue to show some elevation in LDL, continue Zetia daily.  Any questions? Keep being awesome!!  Thank you for allowing me to participate in your care. Kindest regards, Bobbye Petti

## 2021-01-26 ENCOUNTER — Other Ambulatory Visit: Payer: Self-pay | Admitting: Nurse Practitioner

## 2021-01-27 NOTE — Telephone Encounter (Signed)
Requested Prescriptions  Pending Prescriptions Disp Refills  . venlafaxine XR (EFFEXOR-XR) 150 MG 24 hr capsule [Pharmacy Med Name: VENLAFAXINE 150MG  ER CAPSULES] 90 capsule 1    Sig: TAKE 1 CAPSULE(150 MG) BY MOUTH DAILY WITH BREAKFAST     Psychiatry: Antidepressants - SNRI - desvenlafaxine & venlafaxine Failed - 01/26/2021  9:11 PM      Failed - LDL in normal range and within 360 days    LDL Chol Calc (NIH)  Date Value Ref Range Status  01/09/2021 117 (H) 0 - 99 mg/dL Final         Passed - Total Cholesterol in normal range and within 360 days    Cholesterol, Total  Date Value Ref Range Status  01/09/2021 192 100 - 199 mg/dL Final         Passed - Triglycerides in normal range and within 360 days    Triglycerides  Date Value Ref Range Status  01/09/2021 91 0 - 149 mg/dL Final         Passed - Completed PHQ-2 or PHQ-9 in the last 360 days      Passed - Last BP in normal range    BP Readings from Last 1 Encounters:  01/09/21 124/85         Passed - Valid encounter within last 6 months    Recent Outpatient Visits          2 weeks ago Type 2 diabetes mellitus with obesity ([place])   [place], Jolene T, NP   7 months ago Type 2 diabetes mellitus with obesity ([place])   [place], Haigler T, NP   10 months ago Left hand pain   [facility] [place], [place]   1 year ago Left wrist pain   [place], Lilia Argue, [place]   1 year ago Essential hypertension, benign   [place], [place], [place]      Future Appointments            In 5 months Cannady, Henrine Screws T, NP [facility]           . losartan (COZAAR) 50 MG tablet [Pharmacy Med Name: LOSARTAN 50MG  TABLETS] 90 tablet 1    Sig: TAKE 1 TABLET(50 MG) BY MOUTH DAILY     Cardiovascular:  Angiotensin Receptor Blockers Failed - 01/26/2021  9:11 PM      Failed - Cr in normal range and within 180  days    Creat  Date Value Ref Range Status  05/14/2016 0.62 0.50 - 1.05 mg/dL Final    Comment:      For patients > or = 55 years of age: The upper reference limit for Creatinine is approximately 13% higher for people identified as African-American.      Creatinine, Ser  Date Value Ref Range Status  01/09/2021 0.51 (L) 0.57 - 1.00 mg/dL Final         Passed - K in normal range and within 180 days    Potassium  Date Value Ref Range Status  01/09/2021 4.4 3.5 - 5.2 mmol/L Final         Passed - Patient is not pregnant      Passed - Last BP in normal range    BP Readings from Last 1 Encounters:  01/09/21 124/85         Passed - Valid encounter within last 6 months    Recent Outpatient Visits  2 weeks ago Type 2 diabetes mellitus with obesity ([place])   [place], [place] T, NP   7 months ago Type 2 diabetes mellitus with obesity ([place])   [place], [place] T, NP   10 months ago Left hand pain   [facility] [place], [place]   1 year ago Left wrist pain   [facility] [place], [place]   1 year ago Essential hypertension, benign   [place], Lilia Argue, [place]      Future Appointments            In 5 months Cannady, Barbaraann Faster, NP [employer] [facility]

## 2021-01-27 NOTE — Telephone Encounter (Signed)
Scheduled 9/22 per last visit

## 2021-01-27 NOTE — Telephone Encounter (Signed)
Requested medication (s) are due for refill today: yes  Requested medication (s) are on the active medication list: yes  Last refill:  10/26/20  Future visit scheduled: no  Notes to clinic:  insurance requests alternative med    Requested Prescriptions  Pending Prescriptions Disp Refills   losartan (COZAAR) 50 MG tablet [Pharmacy Med Name: LOSARTAN 50MG  TABLETS] 90 tablet 1    Sig: TAKE 1 TABLET(50 MG) BY MOUTH DAILY      Cardiovascular:  Angiotensin Receptor Blockers Failed - 01/26/2021  9:11 PM      Failed - Cr in normal range and within 180 days    Creat  Date Value Ref Range Status  05/14/2016 0.62 0.50 - 1.05 mg/dL Final    Comment:      For patients > or = 55 years of age: The upper reference limit for Creatinine is approximately 13% higher for people identified as African-American.      Creatinine, Ser  Date Value Ref Range Status  01/09/2021 0.51 (L) 0.57 - 1.00 mg/dL Final          Passed - K in normal range and within 180 days    Potassium  Date Value Ref Range Status  01/09/2021 4.4 3.5 - 5.2 mmol/L Final          Passed - Patient is not pregnant      Passed - Last BP in normal range    BP Readings from Last 1 Encounters:  01/09/21 124/85          Passed - Valid encounter within last 6 months    Recent Outpatient Visits           2 weeks ago Type 2 diabetes mellitus with obesity ([place])   [place], Jolene T, NP   7 months ago Type 2 diabetes mellitus with obesity ([place])   [place], [place] T, NP   10 months ago Left hand pain   [place], [place], [place]   1 year ago Left wrist pain   [place], [place], [place]   1 year ago Essential hypertension, benign   [place], Lilia Argue, [place]       Future Appointments             In 5 months Cannady, Barbaraann Faster, NP [facility]              Signed  Prescriptions Disp Refills   venlafaxine XR (EFFEXOR-XR) 150 MG 24 hr capsule 90 capsule 1    Sig: TAKE 1 CAPSULE(150 MG) BY MOUTH DAILY WITH BREAKFAST      Psychiatry: Antidepressants - SNRI - desvenlafaxine & venlafaxine Failed - 01/26/2021  9:11 PM      Failed - LDL in normal range and within 360 days    LDL Chol Calc (NIH)  Date Value Ref Range Status  01/09/2021 117 (H) 0 - 99 mg/dL Final          Passed - Total Cholesterol in normal range and within 360 days    Cholesterol, Total  Date Value Ref Range Status  01/09/2021 192 100 - 199 mg/dL Final          Passed - Triglycerides in normal range and within 360 days    Triglycerides  Date Value Ref Range Status  01/09/2021 91 0 - 149 mg/dL Final          Passed - Completed PHQ-2  or PHQ-9 in the last 360 days      Passed - Last BP in normal range    BP Readings from Last 1 Encounters:  01/09/21 124/85          Passed - Valid encounter within last 6 months    Recent Outpatient Visits           2 weeks ago Type 2 diabetes mellitus with obesity ([place])   [place], Jolene T, NP   7 months ago Type 2 diabetes mellitus with obesity ([place])   [place], [place] T, NP   10 months ago Left hand pain   [facility] [place], [place]   1 year ago Left wrist pain   [facility] [place], [place]   1 year ago Essential hypertension, benign   [place], Lilia Argue, [place]       Future Appointments             In 5 months Cannady, Barbaraann Faster, NP [facility]

## 2021-01-31 ENCOUNTER — Other Ambulatory Visit: Payer: Self-pay | Admitting: Nurse Practitioner

## 2021-02-09 ENCOUNTER — Other Ambulatory Visit: Payer: Self-pay | Admitting: Family Medicine

## 2021-02-10 NOTE — Telephone Encounter (Signed)
Requested Prescriptions  Pending Prescriptions Disp Refills  . ketoconazole (NIZORAL) 2 % cream [Pharmacy Med Name: KETOCONAZOLE 2% CREAM 15GM] 15 g 0    Sig: APPLY EXTERNALLY TO THE AFFECTED AREA DAILY     Over the Counter:  OTC Passed - 02/09/2021 10:42 PM      Passed - Valid encounter within last 12 months    Recent Outpatient Visits          1 month ago Type 2 diabetes mellitus with obesity ([place])   [place], Jolene T, NP   7 months ago Type 2 diabetes mellitus with obesity ([place])   [place], [place] T, NP   10 months ago Left hand pain   [facility] [place], [place]   1 year ago Left wrist pain   [facility] [place], [place]   1 year ago Essential hypertension, benign   [place], Lilia Argue, [place]      Future Appointments            In 5 months Cannady, Barbaraann Faster, NP [facility]

## 2021-04-02 LAB — HM DIABETES EYE EXAM

## 2021-04-08 ENCOUNTER — Other Ambulatory Visit: Payer: Self-pay | Admitting: Family Medicine

## 2021-04-08 NOTE — Telephone Encounter (Signed)
  Notes to clinic: requested script is expired  Review for continued use and refill    Requested Prescriptions  Pending Prescriptions Disp Refills   meloxicam (MOBIC) 15 MG tablet [Pharmacy Med Name: MELOXICAM 15MG  TABLETS] 30 tablet 2    Sig: TAKE 1/2 TO 1 TABLET BY MOUTH ONCE DAILY AS NEEDED      Analgesics:  COX2 Inhibitors Failed - 04/08/2021  8:16 AM      Failed - Cr in normal range and within 360 days    Creat  Date Value Ref Range Status  05/14/2016 0.62 0.50 - 1.05 mg/dL Final    Comment:      For patients > or = 55 years of age: The upper reference limit for Creatinine is approximately 13% higher for people identified as African-American.      Creatinine, Ser  Date Value Ref Range Status  01/09/2021 0.51 (L) 0.57 - 1.00 mg/dL Final          Passed - HGB in normal range and within 360 days    Hemoglobin  Date Value Ref Range Status  08/15/2020 13.8 12.0 - 15.0 g/dL Final  12/21/2019 13.6 11.1 - 15.9 g/dL Final          Passed - Patient is not pregnant      Passed - Valid encounter within last 12 months    Recent Outpatient Visits           2 months ago Type 2 diabetes mellitus with obesity ([place])   [place], Jolene T, NP   9 months ago Type 2 diabetes mellitus with obesity ([place])   [place], [place] T, NP   1 year ago Left hand pain   [facility] [place], [place]   1 year ago Left wrist pain   [facility] [place], [place]   1 year ago Essential hypertension, benign   [place], Lilia Arg[place], [place]       Future Appointments             In 3 months Cannady, Barbaraann Faster, NP [facility]

## 2021-04-08 NOTE — Telephone Encounter (Signed)
Refill request for this medication is refused at this time.  For refill patient will need appointment with provider.

## 2021-04-08 NOTE — Telephone Encounter (Signed)
Pt last apt on 01/09/2021 next apt on 07/10/2021

## 2021-04-09 NOTE — Telephone Encounter (Signed)
Called pt to schedule appt no answer left vm

## 2021-04-10 NOTE — Telephone Encounter (Signed)
Lvm to make this apt. 

## 2021-04-14 ENCOUNTER — Encounter: Payer: Self-pay | Admitting: Nurse Practitioner

## 2021-04-14 NOTE — Telephone Encounter (Signed)
LVM TO  MAKE APT

## 2021-04-14 NOTE — Telephone Encounter (Signed)
SENT LETTER

## 2021-05-07 ENCOUNTER — Other Ambulatory Visit: Payer: Self-pay | Admitting: Nurse Practitioner

## 2021-05-07 ENCOUNTER — Telehealth: Payer: Self-pay | Admitting: Nurse Practitioner

## 2021-05-07 MED ORDER — METFORMIN HCL ER 500 MG PO TB24: ORAL_TABLET | ORAL | 4 refills | Status: DC

## 2021-05-07 NOTE — Telephone Encounter (Signed)
Pt is calling to report her BS is 149 and she is not having any symptoms. Pt states she has been only taking metformin xr 500 mg once a day instead of twice a day due to she need a new rx metformin 500 mg twice a day instead of once a day. [address] phone number [phone]

## 2021-05-07 NOTE — Telephone Encounter (Signed)
Called and left a detailed message for patient letting her know what Jolene said.

## 2021-06-28 ENCOUNTER — Other Ambulatory Visit: Payer: Self-pay | Admitting: Family Medicine

## 2021-06-28 NOTE — Telephone Encounter (Signed)
Requested medications are due for refill today.  unknown  Requested medications are on the active medications list.  no  Last refill. 12/21/2019  Future visit scheduled.   yes  Notes to clinic.  Medication was discontinued 12/19/2020.

## 2021-07-05 ENCOUNTER — Encounter: Payer: Self-pay | Admitting: Nurse Practitioner

## 2021-07-10 ENCOUNTER — Ambulatory Visit: Payer: BC Managed Care – PPO | Admitting: Nurse Practitioner

## 2021-07-10 ENCOUNTER — Other Ambulatory Visit: Payer: Self-pay

## 2021-07-10 ENCOUNTER — Encounter: Payer: Self-pay | Admitting: Nurse Practitioner

## 2021-07-10 VITALS — BP 138/85 | HR 66 | Temp 98.6°F | Ht <= 58 in | Wt 157.2 lb

## 2021-07-10 DIAGNOSIS — E669 Obesity, unspecified: Secondary | ICD-10-CM

## 2021-07-10 DIAGNOSIS — I152 Hypertension secondary to endocrine disorders: Secondary | ICD-10-CM | POA: Diagnosis not present

## 2021-07-10 DIAGNOSIS — E785 Hyperlipidemia, unspecified: Secondary | ICD-10-CM

## 2021-07-10 DIAGNOSIS — E1169 Type 2 diabetes mellitus with other specified complication: Secondary | ICD-10-CM

## 2021-07-10 DIAGNOSIS — Z23 Encounter for immunization: Secondary | ICD-10-CM | POA: Diagnosis not present

## 2021-07-10 DIAGNOSIS — R42 Dizziness and giddiness: Secondary | ICD-10-CM

## 2021-07-10 DIAGNOSIS — R809 Proteinuria, unspecified: Secondary | ICD-10-CM

## 2021-07-10 DIAGNOSIS — E1159 Type 2 diabetes mellitus with other circulatory complications: Secondary | ICD-10-CM | POA: Diagnosis not present

## 2021-07-10 DIAGNOSIS — G4733 Obstructive sleep apnea (adult) (pediatric): Secondary | ICD-10-CM

## 2021-07-10 DIAGNOSIS — T466X5A Adverse effect of antihyperlipidemic and antiarteriosclerotic drugs, initial encounter: Secondary | ICD-10-CM

## 2021-07-10 DIAGNOSIS — F324 Major depressive disorder, single episode, in partial remission: Secondary | ICD-10-CM

## 2021-07-10 DIAGNOSIS — M791 Myalgia, unspecified site: Secondary | ICD-10-CM

## 2021-07-10 DIAGNOSIS — E1129 Type 2 diabetes mellitus with other diabetic kidney complication: Secondary | ICD-10-CM

## 2021-07-10 LAB — BAYER DCA HB A1C WAIVED: HB A1C (BAYER DCA - WAIVED): 6.6 % — ABNORMAL HIGH (ref 4.8–5.6)

## 2021-07-10 MED ORDER — LOSARTAN POTASSIUM 50 MG PO TABS: ORAL_TABLET | ORAL | 4 refills | Status: AC

## 2021-07-10 MED ORDER — OZEMPIC (0.25 OR 0.5 MG/DOSE) 2 MG/1.5ML ~~LOC~~ SOPN
PEN_INJECTOR | SUBCUTANEOUS | 4 refills | Status: AC
Start: 2021-07-10 — End: ?

## 2021-07-10 MED ORDER — VENLAFAXINE HCL ER 75 MG PO CP24: 75.0000 mg | ORAL_CAPSULE | Freq: Every day | ORAL | 4 refills | Status: AC

## 2021-07-10 MED ORDER — AMLODIPINE BESYLATE 5 MG PO TABS: ORAL_TABLET | ORAL | 4 refills | Status: AC

## 2021-07-10 MED ORDER — VENLAFAXINE HCL ER 150 MG PO CP24: ORAL_CAPSULE | ORAL | 4 refills | Status: AC

## 2021-07-10 MED ORDER — EZETIMIBE 10 MG PO TABS: ORAL_TABLET | ORAL | 4 refills | Status: AC

## 2021-07-10 NOTE — Assessment & Plan Note (Signed)
Refer to hyperlipidemia plan, continue Zetia -- may benefit from [place] in future.

## 2021-07-10 NOTE — Progress Notes (Signed)
BP 138/85   Pulse 66   Temp 98.6 F (37 C) (Oral)   Ht 4\' 10"  (1.473 m)   Wt 157 lb 3.2 oz (71.3 kg)   SpO2 98%   BMI 32.85 kg/m    Subjective:    Patient ID: Marissa Baldwin, female    DOB: Aug 10, 1966, 55 y.o.   MRN: [id number]  HPI: ABAGAEL KRAMM is a 55 y.o. female  Chief Complaint  Patient presents with   Diabetes   Hyperlipidemia   Hypertension   New Med Request    Patient states she would like to change her prescription from Metformin to [place]. Patient is also requesting a refill on her inhaler prescription.    DIZZINESS Has had episodes for two weeks and would like ears looked at.  Has some drainage with this Duration: weeks Description of symptoms: ill-defined Duration of episode: seconds Dizziness frequency: no history of the same Provoking factors:  lifting head up or on ladder Aggravating factors:   as above Triggered by rolling over in bed:  at times Triggered by bending over: no Aggravated by head movement: yes Aggravated by exertion, coughing, loud noises: no Recent head injury: no Recent or current viral symptoms: no History of vasovagal episodes: no Nausea: no Vomiting: no Tinnitus: no Hearing loss: no Aural fullness: no Headache: no Photophobia/phonophobia: no Unsteady gait: no Postural instability: no Diplopia, dysarthria, dysphagia or weakness: no Related to exertion: no Pallor: no Diaphoresis: no Dyspnea: no Chest pain: no   DIABETES Continues on Metformin XR 500 MG BID.  Last A1C was 6.8% in March.  She wishes to stop Metformin and start Ozempic instead to help sugars and weight. Hypoglycemic episodes:no Polydipsia/polyuria: no Visual disturbance: no Chest pain: no Paresthesias: no Glucose Monitoring: yes  Accucheck frequency: Daily  Fasting glucose: 1130 and below, had one 73  Post prandial:  Evening:   Before meals: Taking Insulin?: no  Long acting insulin:  Short acting insulin: Blood Pressure Monitoring: rarely Retinal  Examination: Up to Date -- [facility] Foot Exam: Up to Date Pneumovax: Up to Date Influenza: refuses Aspirin: no   HYPERTENSION / HYPERLIPIDEMIA Continues on Losartan 50 MG daily, Zetia, and Amlodipine.  Uses CPAP occasionally -- getting congested when uses. Satisfied with current treatment? yes Duration of hypertension: chronic BP monitoring frequency: rarely BP range:  BP medication side effects: no Duration of hyperlipidemia: chronic Cholesterol medication side effects: no Cholesterol supplements: none Medication compliance: good compliance Aspirin: no Recent stressors: no Recurrent headaches: no Visual changes: no Palpitations: no Dyspnea: no Chest pain: no Lower extremity edema: no Dizzy/lightheaded: no   DEPRESSION Continues on Seroquel 25 MG daily (takes 1/2 of this and not often as side effects) and Effexor XR 225 MG daily + Buspar 5 MG BID. Mood status: controlled Satisfied with current treatment?: yes Symptom severity: moderate  Duration of current treatment : chronic Side effects: no Medication compliance: good compliance Psychotherapy/counseling: none Depressed mood: yes Anxious mood: yes Anhedonia: no Significant weight loss or gain: no Insomnia: yes hard to fall asleep Fatigue: no Feelings of worthlessness or guilt: no Impaired concentration/indecisiveness: no Suicidal ideations: no Hopelessness: no Crying spells: no Depression screen [facility] 2/9 07/10/2021 01/09/2021 06/26/2020 03/20/2020 12/21/2019  Decreased Interest 0 1 1 0 0  Down, Depressed, Hopeless 1 1 1 2  0  PHQ - 2 Score 1 2 2 2  0  Altered sleeping 2 3 1 1 1   Tired, decreased energy 2 1 3 3 2   Change in appetite  1 0 3 2 1   Feeling bad or failure about yourself  0 0 3 0 0  Trouble concentrating 3 0 2 2 1   Moving slowly or fidgety/restless 2 0 1 1 0  Suicidal thoughts 0 0 0 0 0  PHQ-9 Score 11 6 15 11 5   Difficult doing work/chores Somewhat difficult Not difficult at all - Somewhat difficult -   Some recent data might be hidden   GAD 7 : Generalized Anxiety Score 07/10/2021 01/09/2021 12/21/2019  Nervous, Anxious, on Edge 2 2 2   Control/stop worrying 3 2 1   Worry too much - different things 2 2 1   Trouble relaxing 2 2 1   Restless 0 2 0  Easily annoyed or irritable 2 2 1   Afraid - awful might happen 0 2 0  Total GAD 7 Score 11 14 6   Anxiety Difficulty Somewhat difficult Somewhat difficult Somewhat difficult    Relevant past medical, surgical, family and social history reviewed and updated as indicated. Interim medical history since our last visit reviewed. Allergies and medications reviewed and updated.  Review of Systems  Constitutional:  Negative for activity change, appetite change, diaphoresis, fatigue and fever.  Respiratory:  Negative for cough, chest tightness and shortness of breath.   Cardiovascular:  Negative for chest pain, palpitations and leg swelling.  Gastrointestinal: Negative.   Endocrine: Negative for polydipsia, polyphagia and polyuria.  Neurological: Negative.   Psychiatric/Behavioral:  Positive for decreased concentration and sleep disturbance. Negative for self-injury and suicidal ideas. The patient is nervous/anxious.    Per HPI unless specifically indicated above     Objective:    BP 138/85   Pulse 66   Temp 98.6 F (37 C) (Oral)   Ht 4\' 10"  (1.473 m)   Wt 157 lb 3.2 oz (71.3 kg)   SpO2 98%   BMI 32.85 kg/m   Wt Readings from Last 3 Encounters:  07/10/21 157 lb 3.2 oz (71.3 kg)  01/09/21 160 lb (72.6 kg)  09/11/20 163 lb (73.9 kg)    Physical Exam Vitals and nursing note reviewed.  Constitutional:      General: She is awake. She is not in acute distress.    Appearance: She is well-developed and well-groomed. She is obese. She is not ill-appearing.  HENT:     Head: Normocephalic.     Right Ear: Hearing, ear canal and external ear normal. A middle ear effusion is present.     Left Ear: Hearing, ear canal and external ear normal. A  middle ear effusion is present.     Nose: Nose normal.     Right Sinus: No maxillary sinus tenderness or frontal sinus tenderness.     Left Sinus: No maxillary sinus tenderness or frontal sinus tenderness.  Eyes:     General: Lids are normal.        Right eye: No discharge.        Left eye: No discharge.     Conjunctiva/sclera: Conjunctivae normal.     Pupils: Pupils are equal, round, and reactive to light.  Neck:     Thyroid: No thyromegaly.     Vascular: No carotid bruit.  Cardiovascular:     Rate and Rhythm: Normal rate and regular rhythm.     Heart sounds: Normal heart sounds. No murmur heard.   No gallop.  Pulmonary:     Effort: Pulmonary effort is normal. No accessory muscle usage or respiratory distress.     Breath sounds: Normal breath sounds.  Abdominal:  General: Bowel sounds are normal.     Palpations: Abdomen is soft. There is no hepatomegaly or splenomegaly.  Musculoskeletal:     Cervical back: Normal range of motion and neck supple.     Right lower leg: No edema.     Left lower leg: No edema.  Skin:    General: Skin is warm and dry.  Neurological:     Mental Status: She is alert and oriented to person, place, and time.     Cranial Nerves: Cranial nerves are intact.     Sensory: Sensation is intact.     Motor: Motor function is intact.     Coordination: Coordination is intact.     Deep Tendon Reflexes: Reflexes are normal and symmetric.     Reflex Scores:      Brachioradialis reflexes are 2+ on the right side and 2+ on the left side.      Patellar reflexes are 2+ on the right side and 2+ on the left side. Psychiatric:        Attention and Perception: Attention normal.        Mood and Affect: Mood normal.        Speech: Speech normal.        Behavior: Behavior normal.        Thought Content: Thought content normal.    Results for orders placed or performed in visit on 07/10/21  Bayer DCA Hb A1c Waived  Result Value Ref Range   HB A1C (BAYER DCA -  WAIVED) 6.6 (H) 4.8 - 5.6 %      Assessment & Plan:   Problem List Items Addressed This Visit       Cardiovascular and Mediastinum   Hypertension associated with diabetes ([place])    Chronic, ongoing.  BP at goal today.  Continue current medication regimen and adjust as needed.  Recommend she check BP at least 3 days a week at home and document for provider + focus on DASH diet. Losartan for kidney protection with diabetes.  CBC and TSH today.  Return in 3 months.       Relevant Medications   amLODipine (NORVASC) 5 MG tablet   ezetimibe (ZETIA) 10 MG tablet   losartan (COZAAR) 50 MG tablet   Semaglutide,0.25 or 0.5MG /DOS, (OZEMPIC, 0.25 OR 0.5 MG/DOSE,) 2 MG/1.5ML SOPN   Other Relevant Orders   Bayer DCA Hb A1c Waived (Completed)   Comprehensive metabolic panel   CBC with Differential/Platelet     Respiratory   Sleep apnea    Recommend consistent use of CPAP at home.        Endocrine   Hyperlipidemia associated with type 2 diabetes mellitus (HCC)    Chronic, ongoing.  Continue current medication regimen and adjust as needed -- Zetia, as is statin intolerant.  Lipid panel today.  Could consider Repatha in future if LDL not meeting goal.       Relevant Medications   ezetimibe (ZETIA) 10 MG tablet   losartan (COZAAR) 50 MG tablet   Semaglutide,0.25 or 0.5MG /DOS, (OZEMPIC, 0.25 OR 0.5 MG/DOSE,) 2 MG/1.5ML SOPN   Other Relevant Orders   Bayer DCA Hb A1c Waived (Completed)   Comprehensive metabolic panel   Lipid Panel w/o Chol/HDL Ratio   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HCC) - Primary    Chronic, stable A1C.  Today A1C 6.6%, praised for continued good control, urine ALB 30 and A:C 30-300 in March 2022.  Will stop Metformin and trial Ozempic starting at 0.25 MG  weekly x 4 weeks and then increase to 0.5 MG weekly -- discussed with patient, could benefit both weight and glucose.  Recommend she monitor BS at home BID and document for visits.  Focus on diabetic diet.   Continue Losartan for kidney protection.  Return in 3 months.      Relevant Medications   losartan (COZAAR) 50 MG tablet   Semaglutide,0.25 or 0.5MG /DOS, (OZEMPIC, 0.25 OR 0.5 MG/DOSE,) 2 MG/1.5ML SOPN   Other Relevant Orders   Bayer DCA Hb A1c Waived (Completed)     Other   Obesity (BMI 30-39.9)    BMI 32.85.  Recommended eating smaller high protein, low fat meals more frequently and exercising 30 mins a day 5 times a week with a goal of 10-15lb weight loss in the next 3 months. Patient voiced their understanding and motivation to adhere to these recommendations.       Relevant Medications   Semaglutide,0.25 or 0.5MG /DOS, (OZEMPIC, 0.25 OR 0.5 MG/DOSE,) 2 MG/1.5ML SOPN   Depression, major, single episode, in partial remission (HCC)    Chronic, stable.  Denies SI/HI.  Will continue Effexor 225 MG and Buspar 5 MG BID for mood/anxiety.  Monitor BP closely with Efexor and change to alternate regimen as needed. Continue Seroquel at current dose, but recommend minimal use of this.  Plan on follow-up in 6 months.        Relevant Medications   venlafaxine XR (EFFEXOR-XR) 75 MG 24 hr capsule   venlafaxine XR (EFFEXOR-XR) 150 MG 24 hr capsule   Myalgia due to statin    Refer to hyperlipidemia plan, continue Zetia -- may benefit from [place] in future.      Dizziness    Acute, intermittent, suspect related to underlying allergic rhinitis.  Neuro exam reassuring today with no red flags.  Have recommended she start taking Claritin or Allegra daily + Flonase.  Ensure plenty of hydration.  Check labs today.  If ongoing or worsening return to office.      Other Visit Diagnoses     Flu vaccine need       Flu vaccine today   Relevant Orders   Flu Vaccine QUAD 6+ mos PF IM (Fluarix Quad PF) (Completed)        Follow up plan: Return in about 3 months (around 10/09/2021) for T2DM, HTN/HLD, MOOD.

## 2021-07-10 NOTE — Patient Instructions (Signed)
Semaglutide Injection What is this medication? SEMAGLUTIDE (SEM a GLOO tide) treats type 2 diabetes. It works by increasing insulin levels in your body, which decreases your blood sugar (glucose). It also reduces the amount of sugar released into the blood and slows down your digestion. It can also be used to lower the risk of heart attack and stroke in people with type 2 diabetes. Changes to diet and exercise are often combined with this medication. This medicine may be used for other purposes; ask your health care provider or pharmacist if you have questions. COMMON BRAND NAME(S): OZEMPIC What should I tell my care team before I take this medication? They need to know if you have any of these conditions: Endocrine tumors (MEN 2) or if someone in your family had these tumors Eye disease, vision problems History of pancreatitis Kidney disease Stomach problems Thyroid cancer or if someone in your family had thyroid cancer An unusual or allergic reaction to semaglutide, other medications, foods, dyes, or preservatives Pregnant or trying to get pregnant Breast-feeding How should I use this medication? This medication is for injection under the skin of your upper leg (thigh), stomach area, or upper arm. It is given once every week (every 7 days). You will be taught how to prepare and give this medication. Use exactly as directed. Take your medication at regular intervals. Do not take it more often than directed. If you use this medication with insulin, you should inject this medication and the insulin separately. Do not mix them together. Do not give the injections right next to each other. Change (rotate) injection sites with each injection. It is important that you put your used needles and syringes in a special sharps container. Do not put them in a trash can. If you do not have a sharps container, call your pharmacist or care team to get one. A special MedGuide will be given to you by the  pharmacist with each prescription and refill. Be sure to read this information carefully each time. This medication comes with INSTRUCTIONS FOR USE. Ask your pharmacist for directions on how to use this medication. Read the information carefully. Talk to your pharmacist or care team if you have questions. Talk to your care team about the use of this medication in children. Special care may be needed. Overdosage: If you think you have taken too much of this medicine contact a poison control center or emergency room at once. NOTE: This medicine is only for you. Do not share this medicine with others. What if I miss a dose? If you miss a dose, take it as soon as you can within 5 days after the missed dose. Then take your next dose at your regular weekly time. If it has been longer than 5 days after the missed dose, do not take the missed dose. Take the next dose at your regular time. Do not take double or extra doses. If you have questions about a missed dose, contact your care team for advice. What may interact with this medication? Other medications for diabetes Many medications may cause changes in blood sugar, these include: Alcohol containing beverages Antiviral medications for HIV or AIDS Aspirin and aspirin-like medications Certain medications for blood pressure, heart disease, irregular heart beat Chromium Diuretics Female hormones, such as estrogens or progestins, birth control pills Fenofibrate Gemfibrozil Isoniazid Lanreotide Female hormones or anabolic steroids MAOIs like Carbex, Eldepryl, Marplan, Nardil, and Parnate Medications for weight loss Medications for allergies, asthma, cold, or cough Medications for depression,   anxiety, or psychotic disturbances Niacin Nicotine NSAIDs, medications for pain and inflammation, like ibuprofen or naproxen Octreotide Pasireotide Pentamidine Phenytoin Probenecid Quinolone antibiotics such as ciprofloxacin, levofloxacin, ofloxacin Some  herbal dietary supplements Steroid medications such as prednisone or cortisone Sulfamethoxazole; trimethoprim Thyroid hormones Some medications can hide the warning symptoms of low blood sugar (hypoglycemia). You may need to monitor your blood sugar more closely if you are taking one of these medications. These include: Beta-blockers, often used for high blood pressure or heart problems (examples include atenolol, metoprolol, propranolol) Clonidine Guanethidine Reserpine This list may not describe all possible interactions. Give your health care provider a list of all the medicines, herbs, non-prescription drugs, or dietary supplements you use. Also tell them if you smoke, drink alcohol, or use illegal drugs. Some items may interact with your medicine. What should I watch for while using this medication? Visit your care team for regular checks on your progress. Drink plenty of fluids while taking this medication. Check with your care team if you get an attack of severe diarrhea, nausea, and vomiting. The loss of too much body fluid can make it dangerous for you to take this medication. A test called the HbA1C (A1C) will be monitored. This is a simple blood test. It measures your blood sugar control over the last 2 to 3 months. You will receive this test every 3 to 6 months. Learn how to check your blood sugar. Learn the symptoms of low and high blood sugar and how to manage them. Always carry a quick-source of sugar with you in case you have symptoms of low blood sugar. Examples include hard sugar candy or glucose tablets. Make sure others know that you can choke if you eat or drink when you develop serious symptoms of low blood sugar, such as seizures or unconsciousness. They must get medical help at once. Tell your care team if you have high blood sugar. You might need to change the dose of your medication. If you are sick or exercising more than usual, you might need to change the dose of your  medication. Do not skip meals. Ask your care team if you should avoid alcohol. Many nonprescription cough and cold products contain sugar or alcohol. These can affect blood sugar. Pens should never be shared. Even if the needle is changed, sharing may result in passing of viruses like hepatitis or HIV. Wear a medical ID bracelet or chain, and carry a card that describes your disease and details of your medication and dosage times. Do not become pregnant while taking this medication. Women should inform their care team if they wish to become pregnant or think they might be pregnant. There is a potential for serious side effects to an unborn child. Talk to your care team for more information. What side effects may I notice from receiving this medication? Side effects that you should report to your care team as soon as possible: Allergic reactions-skin rash, itching, hives, swelling of the face, lips, tongue, or throat Change in vision Dehydration-increased thirst, dry mouth, feeling faint or lightheaded, headache, dark yellow or brown urine Gallbladder problems-severe stomach pain, nausea, vomiting, fever Heart palpitations-rapid, pounding, or irregular heartbeat Kidney injury-decrease in the amount of urine, swelling of the ankles, hands, or feet Pancreatitis-severe stomach pain that spreads to your back or gets worse after eating or when touched, fever, nausea, vomiting Thyroid cancer-new mass or lump in the neck, pain or trouble swallowing, trouble breathing, hoarseness Side effects that usually do not require medical  attention (report to your care team if they continue or are bothersome): Diarrhea Loss of appetite Nausea Stomach pain Vomiting This list may not describe all possible side effects. Call your doctor for medical advice about side effects. You may report side effects to FDA at 1-800-FDA-1088. Where should I keep my medication? Keep out of the reach of children. Store unopened  pens in a refrigerator between 2 and 8 degrees C (36 and 46 degrees F). Do not freeze. Protect from light and heat. After you first use the pen, it can be stored for 56 days at room temperature between 15 and 30 degrees C (59 and 86 degrees F) or in a refrigerator. Throw away your used pen after 56 days or after the expiration date, whichever comes first. Do not store your pen with the needle attached. If the needle is left on, medication may leak from the pen. NOTE: This sheet is a summary. It may not cover all possible information. If you have questions about this medicine, talk to your doctor, pharmacist, or health care provider.  2022 Elsevier/Gold Standard (2020-12-02 16:01:53)

## 2021-07-10 NOTE — Assessment & Plan Note (Signed)
Acute, intermittent, suspect related to underlying allergic rhinitis.  Neuro exam reassuring today with no red flags.  Have recommended she start taking Claritin or Allegra daily + Flonase.  Ensure plenty of hydration.  Check labs today.  If ongoing or worsening return to office.

## 2021-07-10 NOTE — Assessment & Plan Note (Signed)
Chronic, stable.  Denies SI/HI.  Will continue Effexor 225 MG and Buspar 5 MG BID for mood/anxiety.  Monitor BP closely with Efexor and change to alternate regimen as needed. Continue Seroquel at current dose, but recommend minimal use of this.  Plan on follow-up in 6 months.

## 2021-07-10 NOTE — Assessment & Plan Note (Signed)
Recommend consistent use of CPAP at home.

## 2021-07-10 NOTE — Assessment & Plan Note (Signed)
Chronic, ongoing.  Continue current medication regimen and adjust as needed -- Zetia, as is statin intolerant.  Lipid panel today.  Could consider Repatha in future if LDL not meeting goal.

## 2021-07-10 NOTE — Assessment & Plan Note (Signed)
BMI 32.85.  Recommended eating smaller high protein, low fat meals more frequently and exercising 30 mins a day 5 times a week with a goal of 10-15lb weight loss in the next 3 months. Patient voiced their understanding and motivation to adhere to these recommendations.

## 2021-07-10 NOTE — Assessment & Plan Note (Signed)
Chronic, ongoing.  BP at goal today.  Continue current medication regimen and adjust as needed.  Recommend she check BP at least 3 days a week at home and document for provider + focus on DASH diet. Losartan for kidney protection with diabetes.  CBC and TSH today.  Return in 3 months.

## 2021-07-10 NOTE — Assessment & Plan Note (Addendum)
Chronic, stable A1C.  Today A1C 6.6%, praised for continued good control, urine ALB 30 and A:C 30-300 in March 2022.  Will stop Metformin and trial Ozempic starting at 0.25 MG weekly x 4 weeks and then increase to 0.5 MG weekly -- discussed with patient, could benefit both weight and glucose.  Recommend she monitor BS at home BID and document for visits.  Focus on diabetic diet.  Continue Losartan for kidney protection.  Return in 3 months.

## 2021-07-11 LAB — COMPREHENSIVE METABOLIC PANEL
ALT: 65 IU/L — ABNORMAL HIGH (ref 0–32)
AST: 37 IU/L (ref 0–40)
Albumin/Globulin Ratio: 1.6 (ref 1.2–2.2)
Albumin: 4.6 g/dL (ref 3.8–4.9)
Alkaline Phosphatase: 133 IU/L — ABNORMAL HIGH (ref 44–121)
BUN/Creatinine Ratio: 23 (ref 9–23)
BUN: 14 mg/dL (ref 6–24)
Bilirubin Total: 0.5 mg/dL (ref 0.0–1.2)
CO2: 22 mmol/L (ref 20–29)
Calcium: 9.7 mg/dL (ref 8.7–10.2)
Chloride: 102 mmol/L (ref 96–106)
Creatinine, Ser: 0.62 mg/dL (ref 0.57–1.00)
Globulin, Total: 2.9 g/dL (ref 1.5–4.5)
Glucose: 123 mg/dL — ABNORMAL HIGH (ref 65–99)
Potassium: 4.5 mmol/L (ref 3.5–5.2)
Sodium: 140 mmol/L (ref 134–144)
Total Protein: 7.5 g/dL (ref 6.0–8.5)
eGFR: 105 mL/min/{1.73_m2} (ref >59)

## 2021-07-11 LAB — CBC WITH DIFFERENTIAL/PLATELET
Basophils Absolute: 0.1 10*3/uL (ref 0.0–0.2)
Basos: 1 %
EOS (ABSOLUTE): 0.1 10*3/uL (ref 0.0–0.4)
Eos: 1 %
Hematocrit: 43.3 % (ref 34.0–46.6)
Hemoglobin: 14.2 g/dL (ref 11.1–15.9)
Immature Grans (Abs): 0 10*3/uL (ref 0.0–0.1)
Immature Granulocytes: 0 %
Lymphocytes Absolute: 2.5 10*3/uL (ref 0.7–3.1)
Lymphs: 40 %
MCH: 29.5 pg (ref 26.6–33.0)
MCHC: 32.8 g/dL (ref 31.5–35.7)
MCV: 90 fL (ref 79–97)
Monocytes Absolute: 0.3 10*3/uL (ref 0.1–0.9)
Monocytes: 6 %
Neutrophils Absolute: 3.2 10*3/uL (ref 1.4–7.0)
Neutrophils: 52 %
Platelets: 344 10*3/uL (ref 150–450)
RBC: 4.82 x10E6/uL (ref 3.77–5.28)
RDW: 12 % (ref 11.7–15.4)
WBC: 6.2 10*3/uL (ref 3.4–10.8)

## 2021-07-11 LAB — LIPID PANEL W/O CHOL/HDL RATIO
Cholesterol, Total: 224 mg/dL — ABNORMAL HIGH (ref 100–199)
HDL: 64 mg/dL (ref >39)
LDL Chol Calc (NIH): 144 mg/dL — ABNORMAL HIGH (ref 0–99)
Triglycerides: 93 mg/dL (ref 0–149)
VLDL Cholesterol Cal: 16 mg/dL (ref 5–40)

## 2021-07-11 NOTE — Progress Notes (Signed)
Contacted via MyChart   Good morning Marissa Baldwin, your labs have returned.   - Kidney function is normal, creatinine and eGFR.  Liver function shows mild elevation in ALT, but normal AST, which we will continue to monitor. - CBC is normal - Cholesterol levels remain elevated, even with Zetia on board.  Please ensure you continue to take this daily and if levels continue to be above goal I may discuss with you adding on an injectable like Repatha which could help get cholesterol levels to goal.  Any questions? Keep being amazing!!  Thank you for allowing me to participate in your care.  I appreciate you. Kindest regards, Daronte Shostak

## 2021-07-23 ENCOUNTER — Other Ambulatory Visit: Payer: Self-pay | Admitting: Nurse Practitioner

## 2021-07-23 NOTE — Telephone Encounter (Signed)
A duplicate refill request for the Zetia 10 mg came in from [facility] #[id number].  I called [facility] to see why we received a duplicate request and they were not sure and will delete the duplicate request.

## 2021-08-18 ENCOUNTER — Ambulatory Visit: Payer: Self-pay | Admitting: *Deleted

## 2021-08-18 NOTE — Telephone Encounter (Signed)
Third attempt to reach pt, see previus encounters. CAll not in working order, disconnects. Unable to reach pt after 3 attempts by [facility] NT, routing to provider for resolution per protocol.

## 2021-08-18 NOTE — Telephone Encounter (Signed)
Sent pt a Mychart message letting pt know to call office back to discuss medication concerns due to phone number not ringing and Mychart is active as of today.   Pt is starting new medication Semaglutide,0.25 or 0.5MG /DOS, (OZEMPIC, 0.25 OR 0.5 MG/DOSE,) 2 MG/1.5ML SOPN [[id number]]  and would like to know how she should start it. Should she start with .25 or 0.5. Please advise

## 2021-08-18 NOTE — Telephone Encounter (Signed)
Pt is starting new medication Semaglutide,0.25 or 0.5MG /DOS, (OZEMPIC, 0.25 OR 0.5 MG/DOSE,) 2 MG/1.5ML SOPN [[id number]]  and would like to know how she should start it. Should she start with .25 or 0.5. Please advise   Attempted several times at number provided, pt's phone disconnects as soon as call connects.

## 2021-08-18 NOTE — Telephone Encounter (Signed)
Reason for Disposition  Caller has medicine question only, adult not sick, AND triager answers question  Answer Assessment - Initial Assessment Questions 1. NAME of MEDICATION: "What medicine are you calling about?"     Ozempic 2. QUESTION: "What is your question?" (e.g., double dose of medicine, side effect)     What dose am I supposed to be on? 3. PRESCRIBING HCP: "Who prescribed it?" Reason: if prescribed by specialist, call should be referred to that group.     Marnee Guarneri, NP 4. SYMPTOMS: "Do you have any symptoms?"     N/A 5. SEVERITY: If symptoms are present, ask "Are they mild, moderate or severe?"     N/A 6. PREGNANCY:  "Is there any chance that you are pregnant?" "When was your last menstrual period?"     N/A  Protocols used: Medication Question Call-A-AH

## 2021-08-18 NOTE — Telephone Encounter (Signed)
Pt called in earlier and is returning the call.    She is needing to know what her dose of Ozempic is at this point?    I looked in her chart and let her know 0.5 mg is the dose she should be on.   She replied,   "I though so but wanted to be sure".  She mentioned wanting to change doctors because "[facility] can't seem to get anything right anymore".   I let her know it looked like they had been trying to call but was having an issue with the phone line or something.    "It's not my phone".   "I've been making and receiving calls today".   "They must be dialing the wrong number or something".     I asked if there was anything else I could assist her with and she said,  "No" and thanked me.

## 2021-08-18 NOTE — Telephone Encounter (Signed)
Attempted to call pt on cell number but call disconnects as soon as dialed.   Pt is starting new medication Semaglutide,0.25 or 0.5MG /DOS, (OZEMPIC, 0.25 OR 0.5 MG/DOSE,) 2 MG/1.5ML SOPN [[id number]]  and would like to know how she should start it. Should she start with .25 or 0.5. Please advise

## 2021-08-31 ENCOUNTER — Other Ambulatory Visit: Payer: Self-pay | Admitting: Nurse Practitioner

## 2021-09-01 NOTE — Telephone Encounter (Signed)
Requested Prescriptions  Pending Prescriptions Disp Refills  . 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

## 2021-09-03 ENCOUNTER — Telehealth: Payer: Self-pay | Admitting: Nurse Practitioner

## 2021-09-03 NOTE — Telephone Encounter (Signed)
Call pt to schedule appt [facility] for pt to call the office to the office and schedule appt.//ip

## 2021-09-03 NOTE — Telephone Encounter (Signed)
Copied from [place] [id number]. Topic: General - Other >> Sep 02, 2021  3:34 PM Valere Dross wrote: Reason for CRM: Pt called in about her ongoing ear pain that she was recently seen about due to the water in it, and stated if PCP could call in a medication for the pain, please advise.

## 2021-09-13 ENCOUNTER — Other Ambulatory Visit: Payer: Self-pay | Admitting: Nurse Practitioner

## 2021-09-14 ENCOUNTER — Encounter: Payer: Self-pay | Admitting: Nurse Practitioner

## 2021-09-14 NOTE — Telephone Encounter (Signed)
Last RF 07/10/21 #90 4 RF too soon

## 2021-09-24 DIAGNOSIS — H353132 Nonexudative age-related macular degeneration, bilateral, intermediate dry stage: Secondary | ICD-10-CM | POA: Diagnosis not present

## 2021-09-27 IMAGING — CT CT RENAL STONE PROTOCOL
2 of 4 series · 16 of 46 positions shown, 18 images · non-contrast
Comparison: None.

CLINICAL DATA: Acute right flank pain

EXAM:
CT ABDOMEN AND PELVIS WITHOUT CONTRAST
TECHNIQUE: Multidetector CT imaging of the abdomen and pelvis was performed
following the standard protocol without IV contrast.

[Series 2: stone full standard · axial · 0.72mm/px · z∈[-495,-115]mm · 13 of 84 slices shown, 15 images]
[im 4/84  soft-tissue]
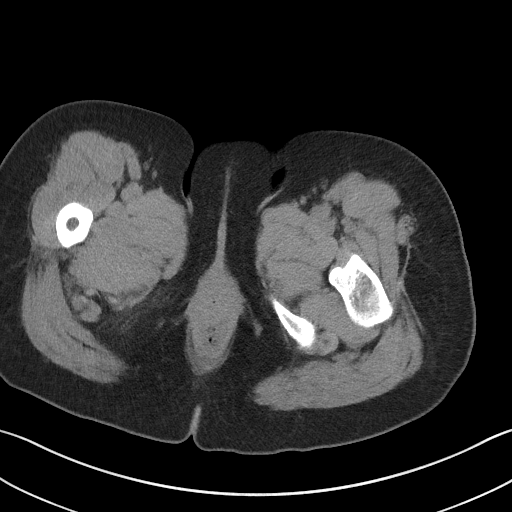
[im 4/84  bone]
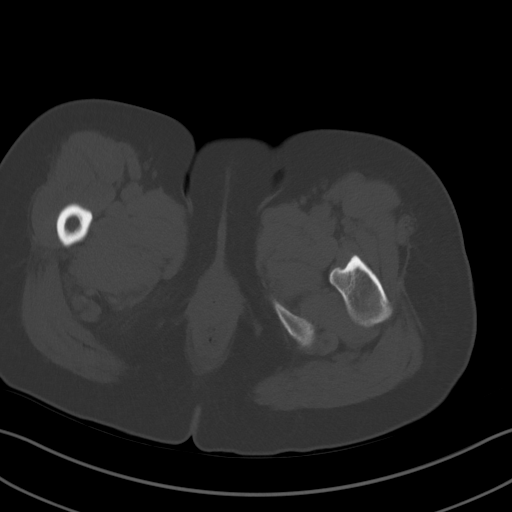
[im 11/84  soft-tissue]
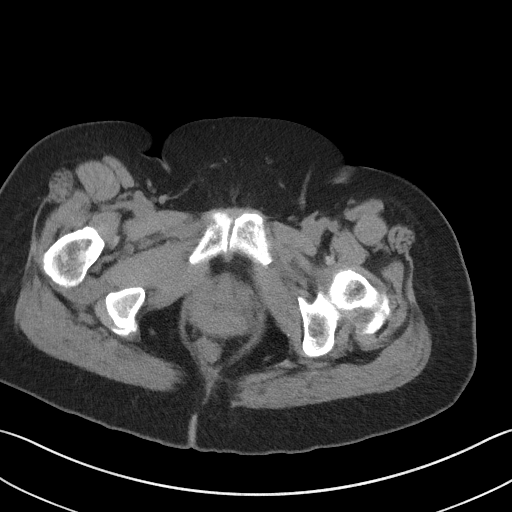
[im 19/84  soft-tissue]
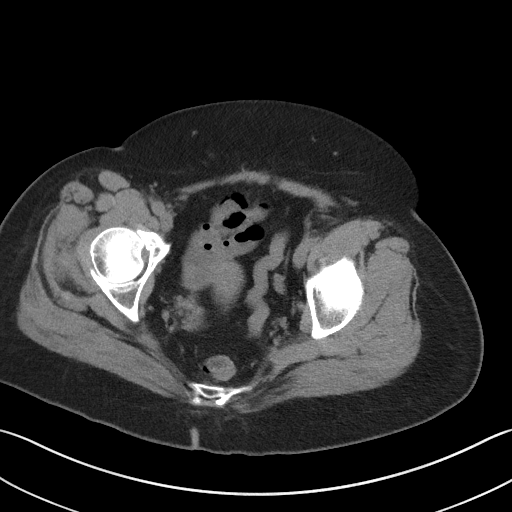
[im 22/84  soft-tissue]
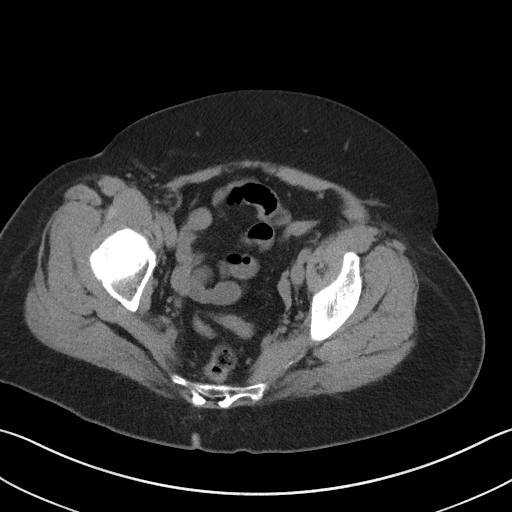
[im 29/84  soft-tissue]
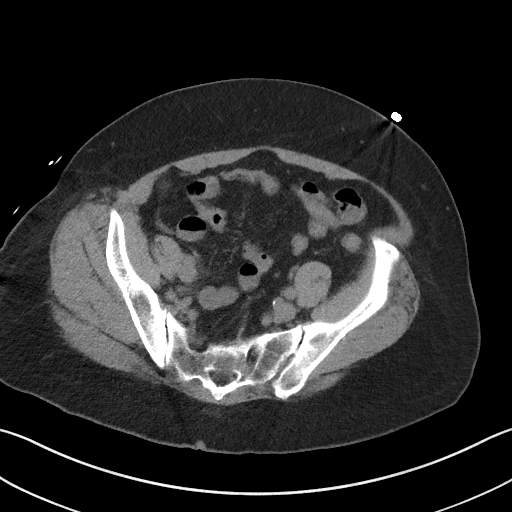
[im 37/84  soft-tissue]
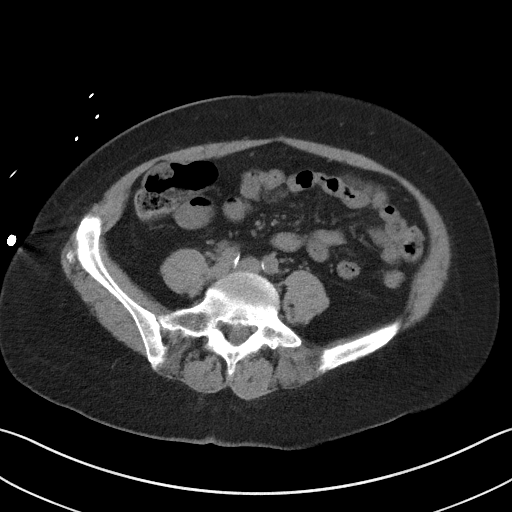
[im 44/84  soft-tissue]
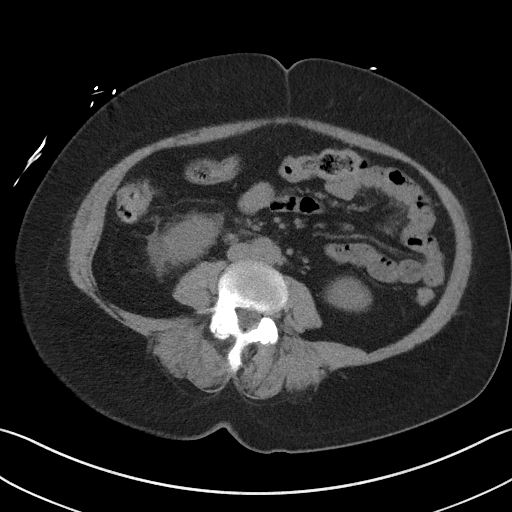
[im 47/84  soft-tissue]
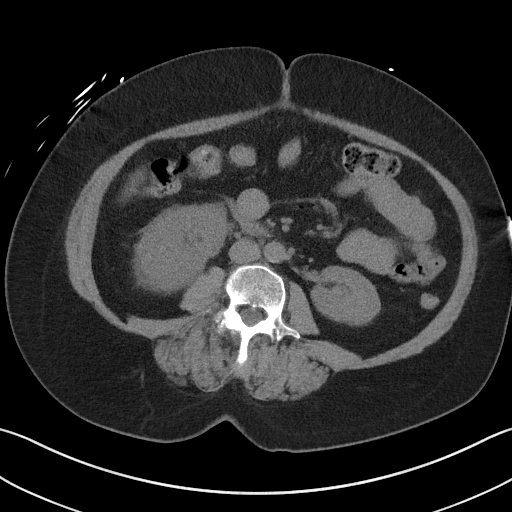
[im 55/84  soft-tissue]
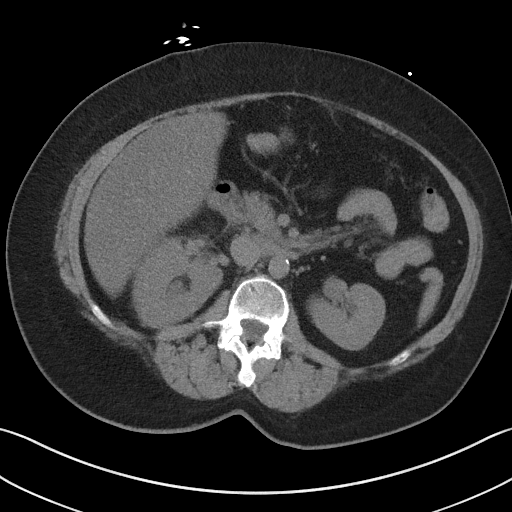
[im 55/84  bone]
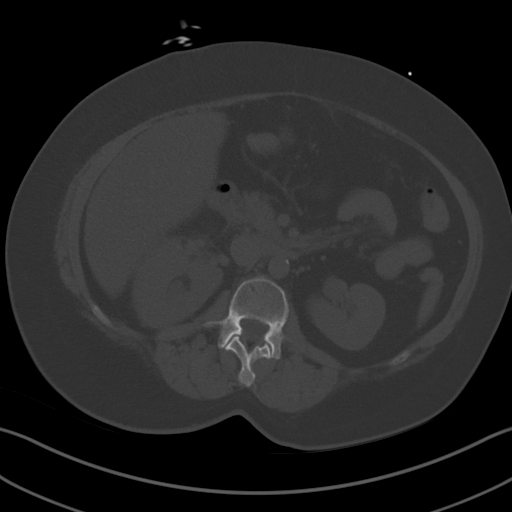
[im 62/84  soft-tissue]
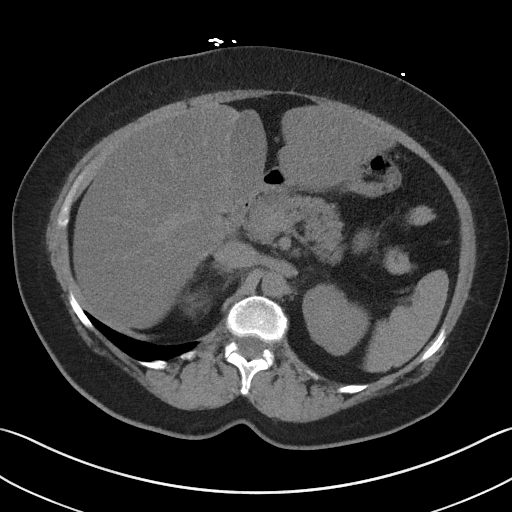
[im 65/84  soft-tissue]
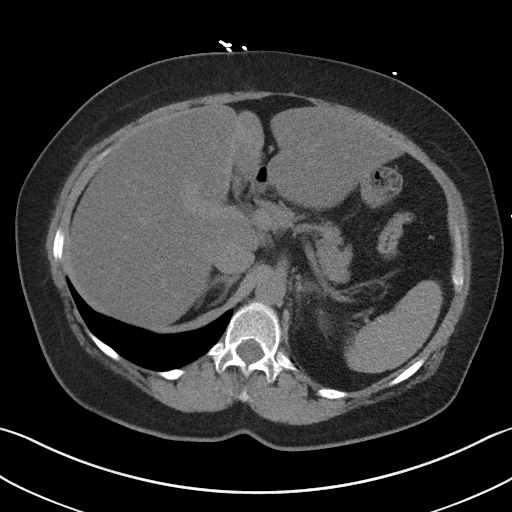
[im 73/84  soft-tissue]
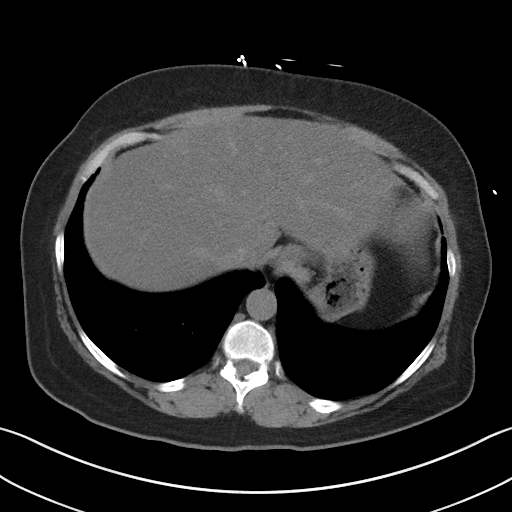
[im 80/84  soft-tissue]
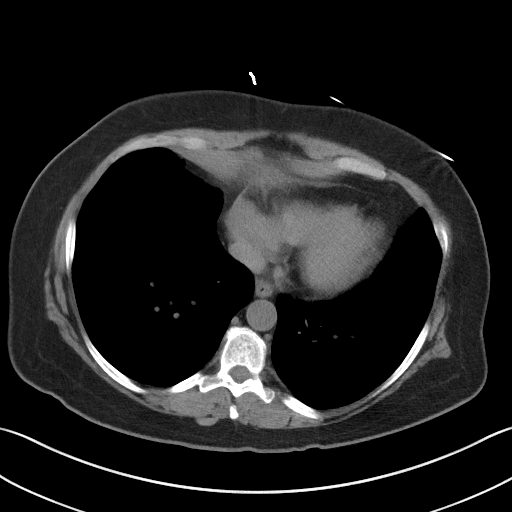

[Series 5: coronal · coronal · 0.73mm/px · 3 of 154 slices shown]
[im 52/154  soft-tissue]
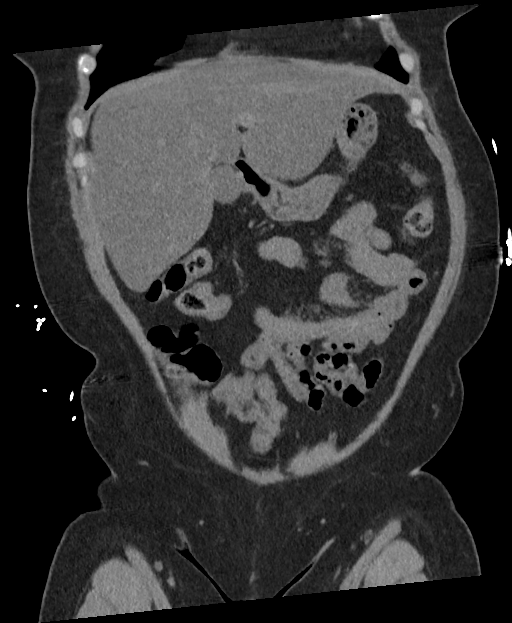
[im 69/154  soft-tissue]
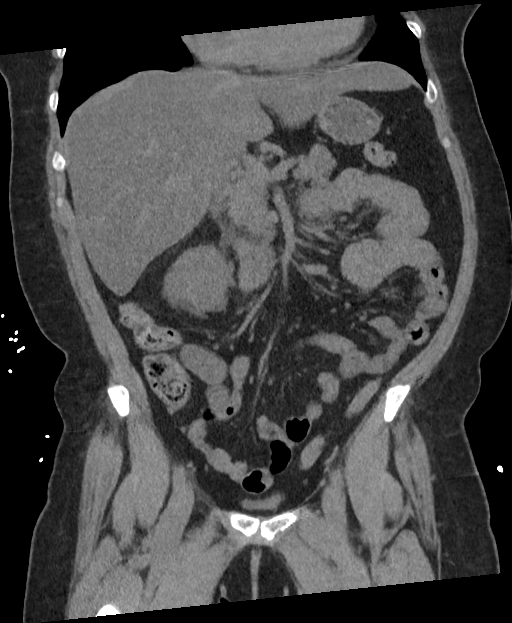
[im 86/154  soft-tissue]
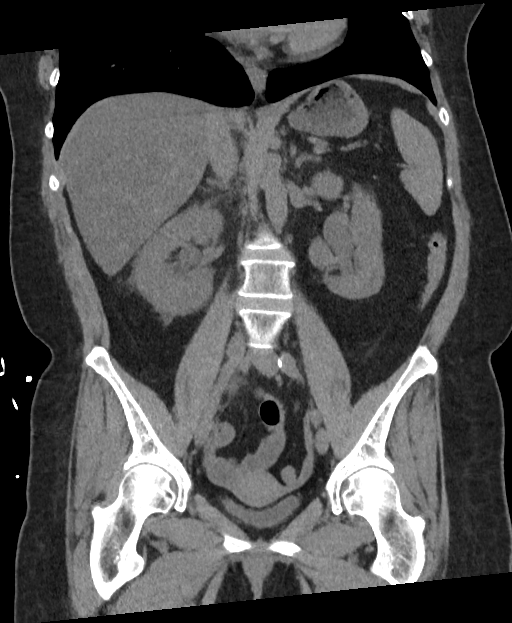

[16 of 46 positions shown; findings below may reference images not displayed]

FINDINGS: Lower chest: No acute abnormality.

Hepatobiliary: Hepatic steatosis. Gallbladder is unremarkable. No
biliary dilatation.

Pancreas: Unremarkable.

Spleen: Unremarkable.

Adrenals/Urinary Tract: There is a 2 mm calculus at the right
ureterovesical junction. Mild right hydroureteronephrosis. Right
perinephric stranding. Punctate 1 mm nonobstructing calculus of the
interpolar right kidney. Adrenals are unremarkable. Bladder is
poorly distended but otherwise unremarkable.

Stomach/Bowel: Stomach is within normal limits. Bowel is normal in
caliber. Normal appendix.

Vascular/Lymphatic: Mild aortic atherosclerosis. No enlarged lymph
nodes identified.

Reproductive: Uterus and bilateral adnexa are unremarkable.

Other: No ascites.  Abdominal wall is unremarkable.

Musculoskeletal: No acute osseous abnormality.
IMPRESSION: 2 mm obstructing calculus at the right ureterovesical junction with
mild hydroureteronephrosis.

Punctate nonobstructing right renal calculus.

Hepatic steatosis.

## 2021-10-01 DIAGNOSIS — G4733 Obstructive sleep apnea (adult) (pediatric): Secondary | ICD-10-CM | POA: Diagnosis not present

## 2021-10-08 ENCOUNTER — Ambulatory Visit: Payer: BC Managed Care – PPO | Admitting: Nurse Practitioner

## 2021-11-01 DIAGNOSIS — G4733 Obstructive sleep apnea (adult) (pediatric): Secondary | ICD-10-CM | POA: Diagnosis not present

## 2021-11-26 ENCOUNTER — Other Ambulatory Visit: Payer: Self-pay | Admitting: Nurse Practitioner

## 2021-11-26 NOTE — Telephone Encounter (Signed)
Requesting early. NO SHOW 10/08/21. 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

## 2021-12-02 DIAGNOSIS — G4733 Obstructive sleep apnea (adult) (pediatric): Secondary | ICD-10-CM | POA: Diagnosis not present

## 2022-01-06 DIAGNOSIS — Z0000 Encounter for general adult medical examination without abnormal findings: Secondary | ICD-10-CM | POA: Diagnosis not present

## 2022-01-06 DIAGNOSIS — E782 Mixed hyperlipidemia: Secondary | ICD-10-CM | POA: Diagnosis not present

## 2022-01-06 DIAGNOSIS — E1169 Type 2 diabetes mellitus with other specified complication: Secondary | ICD-10-CM | POA: Diagnosis not present

## 2022-01-06 DIAGNOSIS — M791 Myalgia, unspecified site: Secondary | ICD-10-CM | POA: Diagnosis not present

## 2022-03-12 ENCOUNTER — Telehealth: Payer: Self-pay

## 2022-03-12 NOTE — Telephone Encounter (Signed)
Called and LVM asking for patient to please return my call. Patient is overdue for her mammogram.   OK for [facility] to speak with the patient if she calls back. Please find out if she is ok with having a mammogram ordered and scheduled for her.

## 2022-05-14 DIAGNOSIS — G4733 Obstructive sleep apnea (adult) (pediatric): Secondary | ICD-10-CM | POA: Diagnosis not present

## 2022-06-14 DIAGNOSIS — G4733 Obstructive sleep apnea (adult) (pediatric): Secondary | ICD-10-CM | POA: Diagnosis not present

## 2022-07-08 DIAGNOSIS — E782 Mixed hyperlipidemia: Secondary | ICD-10-CM | POA: Diagnosis not present

## 2022-07-08 DIAGNOSIS — E1169 Type 2 diabetes mellitus with other specified complication: Secondary | ICD-10-CM | POA: Diagnosis not present

## 2022-07-15 DIAGNOSIS — Z23 Encounter for immunization: Secondary | ICD-10-CM | POA: Diagnosis not present

## 2022-07-15 DIAGNOSIS — E782 Mixed hyperlipidemia: Secondary | ICD-10-CM | POA: Diagnosis not present

## 2022-07-15 DIAGNOSIS — G4733 Obstructive sleep apnea (adult) (pediatric): Secondary | ICD-10-CM | POA: Diagnosis not present

## 2022-07-15 DIAGNOSIS — E1169 Type 2 diabetes mellitus with other specified complication: Secondary | ICD-10-CM | POA: Diagnosis not present

## 2022-09-21 ENCOUNTER — Other Ambulatory Visit: Payer: Self-pay | Admitting: Nurse Practitioner

## 2022-09-21 NOTE — Telephone Encounter (Signed)
Patient called, left VM to return the call to the office to scheduled an appt for medication refill request.   

## 2022-09-21 NOTE — Telephone Encounter (Signed)
Requested medication (s) are due for refill today: yes  Requested medication (s) are on the active medication list: yes  Last refill:  07/10/21 #90/4  Future visit scheduled: no  Notes to clinic:  Unable to refill per protocol, appointment needed.      Requested Prescriptions  Pending Prescriptions Disp Refills   venlafaxine XR (EFFEXOR-XR) 75 MG 24 hr capsule [Pharmacy Med Name: VENLAFAXINE ER '75MG'$  CAPSULES] 90 capsule 4    Sig: TAKE 1 CAPSULE BY MOUTH DAILY WITH BREAKFAST. TAKE WITH YOUR 150 MG CAPSULE TO EQUAL 225 MG DAILY     Psychiatry: Antidepressants - SNRI - desvenlafaxine & venlafaxine Failed - 09/21/2022  3:38 AM      Failed - Cr in normal range and within 360 days    Creat  Date Value Ref Range Status  05/14/2016 0.62 0.50 - 1.05 mg/dL Final    Comment:      For patients > or = 56 years of age: The upper reference limit for Creatinine is approximately 13% higher for people identified as African-American.      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[place])   [place], [place] T, NP   1 year ago Type 2 diabetes mellitus with obesity ([place])   [place], [place] T, NP   2 years ago Type 2 diabetes mellitus with obesity ([place])   [place], [place] T, NP   2 years ago Left hand pain   [place], [place], [place]   2 years ago Left wrist pain   [place], [place], [place]              Failed - Lipid Panel in normal range within the last 12 months    Cholesterol, Total  Date Value Ref Range Status  07/10/2021 224 (H) 100 - 199 mg/dL Final   LDL Chol Calc (NIH)  Date Value Ref Range Status   07/10/2021 144 (H) 0 - 99 mg/dL Final   HDL  Date Value Ref Range Status  07/10/2021 64 >39 mg/dL Final   Triglycerides  Date Value Ref Range Status  07/10/2021 93 0 - 149 mg/dL Final         Passed - Last BP in normal range    BP Readings from Last 1 Encounters:  07/10/21 138/85

## 2022-10-05 ENCOUNTER — Other Ambulatory Visit: Payer: Self-pay | Admitting: Nurse Practitioner

## 2022-10-06 NOTE — Telephone Encounter (Signed)
Called patient to schedule appt for medication refills. Busy signal noted. In review of chart patient now going to [facility].

## 2022-10-06 NOTE — Telephone Encounter (Signed)
Requested medication (s) are due for refill today: expired medication  Requested medication (s) are on the active medication list: yes  Last refill:  07/10/21 #90 4 refills  Future visit scheduled: no   Notes to clinic:  expired medication. In review of chart patient last seen at [facility]. Called patient to schedule appt for med refills. Busy signal noted. Do you want to renew Rx?     Requested Prescriptions  Pending Prescriptions Disp Refills   ezetimibe (ZETIA) 10 MG tablet [Pharmacy Med Name: EZETIMIBE '10MG'$  TABLETS] 90 tablet 4    Sig: TAKE 1 TABLET(10 MG) BY MOUTH DAILY     Cardiovascular:  Antilipid - Sterol Transport Inhibitors Failed - 10/05/2022  3:37 AM      Failed - AST in normal range and within 360 days    AST  Date Value Ref Range Status  07/10/2021 37 0 - 40 IU/L Final   AST (SGOT) Piccolo, Waived  Date Value Ref Range Status  12/03/2015 35 11 - 38 U/L Final         Failed - ALT in normal range and within 360 days    ALT  Date Value Ref Range Status  07/10/2021 65 (H) 0 - 32 IU/L Final   ALT (SGPT) Piccolo, Waived  Date Value Ref Range Status  12/03/2015 40 10 - 47 U/L Final         Failed - Valid encounter within last 12 months    Recent Outpatient Visits           1 year ago 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[place])   [place], Jolene T, NP   1 year ago Type 2 diabetes mellitus with obesity ([place])   [place], [place] T, NP   2 years ago Type 2 diabetes mellitus with obesity ([place])   [place], [place] T, NP   2 years ago Left hand pain   [place], [place], [place]   2 years ago Left wrist pain   [place], [place], [place]              Failed - Lipid Panel in normal range within the last 12 months    Cholesterol, Total  Date Value Ref Range Status  07/10/2021 224 (H) 100 - 199 mg/dL Final   LDL Chol Calc  (NIH)  Date Value Ref Range Status  07/10/2021 144 (H) 0 - 99 mg/dL Final   HDL  Date Value Ref Range Status  07/10/2021 64 >39 mg/dL Final   Triglycerides  Date Value Ref Range Status  07/10/2021 93 0 - 149 mg/dL Final         Passed - Patient is not pregnant

## 2023-07-14 ENCOUNTER — Other Ambulatory Visit: Payer: Self-pay

## 2023-07-15 ENCOUNTER — Other Ambulatory Visit: Payer: Self-pay

## 2023-07-15 ENCOUNTER — Encounter: Payer: Self-pay | Admitting: Pharmacist

## 2023-07-15 MED ORDER — METHOCARBAMOL 750 MG PO TABS
750.0000 mg | ORAL_TABLET | Freq: Every evening | ORAL | 1 refills | Status: AC | PRN
  Filled 2023-10-16: qty 30, 30d supply, fill #0

## 2023-07-15 MED ORDER — TRULICITY 0.75 MG/0.5ML ~~LOC~~ SOAJ
0.7500 mg | SUBCUTANEOUS | 5 refills | Status: AC
  Filled 2023-07-20: qty 2, 28d supply, fill #0

## 2023-07-15 MED ORDER — DEXCOM G7 RECEIVER DEVI: 1.0000 | 0 refills | Status: AC

## 2023-07-15 MED ORDER — TRULICITY 0.75 MG/0.5ML ~~LOC~~ SOAJ
0.7500 mg | SUBCUTANEOUS | 5 refills | Status: DC
  Filled 2023-08-16: qty 2, 28d supply, fill #0

## 2023-07-15 MED ORDER — AMOXICILLIN 875 MG PO TABS: 875.0000 mg | ORAL_TABLET | Freq: Two times a day (BID) | ORAL | 0 refills | Status: AC

## 2023-07-15 MED ORDER — BUSPIRONE HCL 10 MG PO TABS
10.0000 mg | ORAL_TABLET | Freq: Every day | ORAL | 1 refills | Status: DC
  Filled 2023-10-16: qty 30, 30d supply, fill #0
  Filled 2023-12-02: qty 30, 30d supply, fill #1

## 2023-07-15 MED ORDER — OZEMPIC (1 MG/DOSE) 4 MG/3ML ~~LOC~~ SOPN: 1.0000 mg | PEN_INJECTOR | SUBCUTANEOUS | 1 refills | Status: AC

## 2023-07-15 MED ORDER — EZETIMIBE 10 MG PO TABS
10.0000 mg | ORAL_TABLET | Freq: Every day | ORAL | 2 refills | Status: DC
  Filled 2023-08-16: qty 30, 30d supply, fill #0

## 2023-07-20 ENCOUNTER — Other Ambulatory Visit: Payer: Self-pay

## 2023-07-20 MED ORDER — HYDROCHLOROTHIAZIDE 25 MG PO TABS
25.0000 mg | ORAL_TABLET | Freq: Every day | ORAL | 11 refills | Status: AC
  Filled 2023-07-20: qty 30, 30d supply, fill #0
  Filled 2023-09-04: qty 30, 30d supply, fill #1
  Filled 2023-10-04: qty 30, 30d supply, fill #2
  Filled 2023-11-02: qty 30, 30d supply, fill #3
  Filled 2023-12-02: qty 30, 30d supply, fill #4
  Filled 2024-02-02: qty 30, 30d supply, fill #5
  Filled 2024-07-01: qty 30, 30d supply, fill #6

## 2023-07-20 MED ORDER — DEXCOM G7 SENSOR MISC
1.0000 | 0 refills | Status: DC
  Filled 2023-07-20: qty 3, 30d supply, fill #0

## 2023-07-20 MED ORDER — EZETIMIBE 10 MG PO TABS
10.0000 mg | ORAL_TABLET | Freq: Every day | ORAL | 3 refills | Status: AC
  Filled 2023-07-20: qty 90, 90d supply, fill #0
  Filled 2023-07-22: qty 30, 30d supply, fill #0
  Filled 2023-08-16: qty 30, 30d supply, fill #1
  Filled 2023-09-13: qty 30, 30d supply, fill #2
  Filled 2023-10-16: qty 30, 30d supply, fill #3
  Filled 2023-11-19: qty 30, 30d supply, fill #4
  Filled 2024-03-24: qty 30, 30d supply, fill #5

## 2023-07-20 MED ORDER — ETODOLAC 400 MG PO TABS
400.0000 mg | ORAL_TABLET | Freq: Two times a day (BID) | ORAL | 3 refills | Status: DC
  Filled 2023-07-20: qty 180, 90d supply, fill #0
  Filled 2023-09-13: qty 60, 30d supply, fill #0

## 2023-07-21 DIAGNOSIS — E1169 Type 2 diabetes mellitus with other specified complication: Secondary | ICD-10-CM | POA: Diagnosis not present

## 2023-07-21 DIAGNOSIS — E782 Mixed hyperlipidemia: Secondary | ICD-10-CM | POA: Diagnosis not present

## 2023-07-22 ENCOUNTER — Other Ambulatory Visit: Payer: Self-pay

## 2023-07-28 DIAGNOSIS — E1169 Type 2 diabetes mellitus with other specified complication: Secondary | ICD-10-CM | POA: Diagnosis not present

## 2023-07-28 DIAGNOSIS — Z23 Encounter for immunization: Secondary | ICD-10-CM | POA: Diagnosis not present

## 2023-07-28 DIAGNOSIS — E782 Mixed hyperlipidemia: Secondary | ICD-10-CM | POA: Diagnosis not present

## 2023-07-29 ENCOUNTER — Other Ambulatory Visit: Payer: Self-pay

## 2023-07-29 DIAGNOSIS — G8929 Other chronic pain: Secondary | ICD-10-CM | POA: Diagnosis not present

## 2023-07-29 DIAGNOSIS — M533 Sacrococcygeal disorders, not elsewhere classified: Secondary | ICD-10-CM | POA: Diagnosis not present

## 2023-07-29 DIAGNOSIS — M25551 Pain in right hip: Secondary | ICD-10-CM | POA: Diagnosis not present

## 2023-07-29 DIAGNOSIS — M5441 Lumbago with sciatica, right side: Secondary | ICD-10-CM | POA: Diagnosis not present

## 2023-07-29 DIAGNOSIS — M47816 Spondylosis without myelopathy or radiculopathy, lumbar region: Secondary | ICD-10-CM | POA: Diagnosis not present

## 2023-07-29 DIAGNOSIS — M1611 Unilateral primary osteoarthritis, right hip: Secondary | ICD-10-CM | POA: Diagnosis not present

## 2023-07-29 MED ORDER — METHOCARBAMOL 750 MG PO TABS
750.0000 mg | ORAL_TABLET | Freq: Every evening | ORAL | 3 refills | Status: AC | PRN
  Filled 2023-07-29: qty 30, 30d supply, fill #0

## 2023-08-03 ENCOUNTER — Other Ambulatory Visit: Payer: Self-pay

## 2023-08-16 ENCOUNTER — Other Ambulatory Visit: Payer: Self-pay

## 2023-08-17 ENCOUNTER — Other Ambulatory Visit: Payer: Self-pay

## 2023-08-17 MED ORDER — VENLAFAXINE HCL ER 150 MG PO CP24
150.0000 mg | ORAL_CAPSULE | Freq: Every day | ORAL | 3 refills | Status: AC
  Filled 2023-08-17: qty 30, 30d supply, fill #0
  Filled 2023-09-13: qty 30, 30d supply, fill #1
  Filled 2023-10-16: qty 30, 30d supply, fill #2
  Filled 2023-12-02: qty 30, 30d supply, fill #3

## 2023-08-18 ENCOUNTER — Other Ambulatory Visit: Payer: Self-pay

## 2023-08-18 MED ORDER — TRULICITY 1.5 MG/0.5ML ~~LOC~~ SOAJ
1.5000 mg | SUBCUTANEOUS | 5 refills | Status: AC
  Filled 2023-08-18 – 2024-01-20 (×4): qty 2, 28d supply, fill #0

## 2023-08-19 ENCOUNTER — Other Ambulatory Visit: Payer: Self-pay

## 2023-08-19 MED ORDER — CLOBETASOL PROPIONATE 0.05 % EX CREA
TOPICAL_CREAM | Freq: Two times a day (BID) | CUTANEOUS | 0 refills | Status: AC
  Filled 2023-08-19 – 2023-09-06 (×2): qty 30, 30d supply, fill #0

## 2023-08-31 ENCOUNTER — Other Ambulatory Visit: Payer: Self-pay

## 2023-09-06 ENCOUNTER — Other Ambulatory Visit: Payer: Self-pay

## 2023-09-13 ENCOUNTER — Other Ambulatory Visit: Payer: Self-pay

## 2023-09-15 ENCOUNTER — Other Ambulatory Visit: Payer: Self-pay

## 2023-09-19 ENCOUNTER — Other Ambulatory Visit: Payer: Self-pay

## 2023-09-20 ENCOUNTER — Other Ambulatory Visit: Payer: Self-pay

## 2023-09-21 ENCOUNTER — Other Ambulatory Visit: Payer: Self-pay

## 2023-09-22 ENCOUNTER — Other Ambulatory Visit: Payer: Self-pay

## 2023-09-22 MED ORDER — ETODOLAC 400 MG PO TABS
400.0000 mg | ORAL_TABLET | Freq: Two times a day (BID) | ORAL | 3 refills | Status: AC
  Filled 2023-09-22 – 2023-10-16 (×2): qty 60, 30d supply, fill #0
  Filled 2023-12-02: qty 60, 30d supply, fill #1
  Filled 2024-02-02: qty 60, 30d supply, fill #2

## 2023-09-23 ENCOUNTER — Other Ambulatory Visit: Payer: Self-pay

## 2023-10-04 ENCOUNTER — Other Ambulatory Visit: Payer: Self-pay

## 2023-10-17 ENCOUNTER — Other Ambulatory Visit: Payer: Self-pay

## 2023-11-02 ENCOUNTER — Other Ambulatory Visit: Payer: Self-pay

## 2023-11-04 ENCOUNTER — Other Ambulatory Visit: Payer: Self-pay

## 2023-11-04 MED ORDER — AZITHROMYCIN 250 MG PO TABS
ORAL_TABLET | ORAL | 0 refills | Status: AC
  Filled 2023-11-04: qty 6, 5d supply, fill #0

## 2023-11-19 ENCOUNTER — Other Ambulatory Visit: Payer: Self-pay

## 2023-12-21 ENCOUNTER — Other Ambulatory Visit: Payer: Self-pay

## 2023-12-21 MED ORDER — EZETIMIBE 10 MG PO TABS
10.0000 mg | ORAL_TABLET | Freq: Every day | ORAL | 3 refills | Status: AC
  Filled 2023-12-21 – 2023-12-28 (×3): qty 90, 90d supply, fill #0
  Filled 2024-04-25: qty 90, 90d supply, fill #1
  Filled 2024-08-03: qty 90, 90d supply, fill #2
  Filled 2024-10-29: qty 90, 90d supply, fill #3

## 2023-12-24 ENCOUNTER — Other Ambulatory Visit: Payer: Self-pay

## 2023-12-28 ENCOUNTER — Other Ambulatory Visit: Payer: Self-pay

## 2024-01-03 ENCOUNTER — Other Ambulatory Visit: Payer: Self-pay

## 2024-01-03 MED ORDER — PHENTERMINE HCL 30 MG PO CAPS
30.0000 mg | ORAL_CAPSULE | ORAL | 2 refills | Status: DC
  Filled 2024-01-03: qty 15, 30d supply, fill #0
  Filled 2024-04-25 – 2024-06-07 (×3): qty 15, 30d supply, fill #1
  Filled 2024-07-01: qty 15, 30d supply, fill #2

## 2024-01-03 MED ORDER — VENLAFAXINE HCL ER 150 MG PO CP24
150.0000 mg | ORAL_CAPSULE | Freq: Every day | ORAL | 3 refills | Status: AC
  Filled 2024-01-03: qty 90, 90d supply, fill #0
  Filled 2024-03-24: qty 90, 90d supply, fill #1

## 2024-01-03 MED ORDER — HYDROCHLOROTHIAZIDE 25 MG PO TABS
25.0000 mg | ORAL_TABLET | Freq: Every day | ORAL | 11 refills | Status: DC
  Filled 2024-01-03: qty 30, 30d supply, fill #0
  Filled 2024-04-25: qty 30, 30d supply, fill #1

## 2024-01-11 ENCOUNTER — Other Ambulatory Visit: Payer: Self-pay

## 2024-01-20 ENCOUNTER — Other Ambulatory Visit: Payer: Self-pay

## 2024-01-20 MED ORDER — TRULICITY 1.5 MG/0.5ML ~~LOC~~ SOAJ
1.5000 mg | SUBCUTANEOUS | 5 refills | Status: DC
  Filled 2024-01-20: qty 2, 28d supply, fill #0

## 2024-01-21 ENCOUNTER — Other Ambulatory Visit: Payer: Self-pay

## 2024-01-21 MED ORDER — OZEMPIC (0.25 OR 0.5 MG/DOSE) 2 MG/3ML ~~LOC~~ SOPN
0.5000 mg | PEN_INJECTOR | SUBCUTANEOUS | 5 refills | Status: DC
  Filled 2024-01-21 – 2024-01-24 (×2): qty 3, 28d supply, fill #0

## 2024-01-24 ENCOUNTER — Other Ambulatory Visit: Payer: Self-pay

## 2024-01-26 ENCOUNTER — Other Ambulatory Visit: Payer: Self-pay

## 2024-01-26 MED ORDER — BUSPIRONE HCL 10 MG PO TABS
10.0000 mg | ORAL_TABLET | Freq: Every day | ORAL | 3 refills | Status: AC
  Filled 2024-01-26: qty 30, 30d supply, fill #0
  Filled 2024-03-24: qty 30, 30d supply, fill #1
  Filled 2024-06-07 – 2024-07-26 (×2): qty 30, 30d supply, fill #2
  Filled 2024-10-29: qty 30, 30d supply, fill #3

## 2024-01-26 MED ORDER — OZEMPIC (0.25 OR 0.5 MG/DOSE) 2 MG/3ML ~~LOC~~ SOPN
0.5000 mg | PEN_INJECTOR | SUBCUTANEOUS | 5 refills | Status: DC
  Filled 2024-01-26 – 2024-03-24 (×8): qty 3, 28d supply, fill #0

## 2024-01-28 ENCOUNTER — Other Ambulatory Visit (HOSPITAL_COMMUNITY): Payer: Self-pay

## 2024-01-28 ENCOUNTER — Other Ambulatory Visit: Payer: Self-pay

## 2024-02-02 ENCOUNTER — Other Ambulatory Visit: Payer: Self-pay

## 2024-02-03 ENCOUNTER — Other Ambulatory Visit: Payer: Self-pay

## 2024-02-04 ENCOUNTER — Other Ambulatory Visit: Payer: Self-pay

## 2024-02-07 ENCOUNTER — Other Ambulatory Visit: Payer: Self-pay

## 2024-02-09 ENCOUNTER — Other Ambulatory Visit: Payer: Self-pay

## 2024-02-15 ENCOUNTER — Other Ambulatory Visit: Payer: Self-pay

## 2024-02-15 MED ORDER — GLIPIZIDE ER 2.5 MG PO TB24
2.5000 mg | ORAL_TABLET | Freq: Every day | ORAL | 3 refills | Status: AC
  Filled 2024-02-15: qty 90, 90d supply, fill #0
  Filled 2024-05-18: qty 30, 30d supply, fill #1
  Filled 2024-05-18: qty 90, 90d supply, fill #1
  Filled 2024-05-18 (×2): qty 30, 30d supply, fill #1
  Filled 2024-06-27: qty 30, 30d supply, fill #2
  Filled 2024-07-24: qty 30, 30d supply, fill #3
  Filled 2024-08-21 – 2024-09-01 (×2): qty 30, 30d supply, fill #4
  Filled 2024-09-30: qty 30, 30d supply, fill #5
  Filled 2024-10-25: qty 30, 30d supply, fill #6

## 2024-02-16 ENCOUNTER — Other Ambulatory Visit: Payer: Self-pay

## 2024-02-16 MED ORDER — TOBRAMYCIN 0.3 % OP SOLN
1.0000 [drp] | Freq: Four times a day (QID) | OPHTHALMIC | 4 refills | Status: AC
  Filled 2024-02-16: qty 5, 25d supply, fill #0

## 2024-03-03 ENCOUNTER — Other Ambulatory Visit: Payer: Self-pay

## 2024-03-03 MED ORDER — METHOCARBAMOL 750 MG PO TABS
750.0000 mg | ORAL_TABLET | Freq: Every day | ORAL | 3 refills | Status: AC
  Filled 2024-03-03: qty 30, 30d supply, fill #0
  Filled 2024-07-01: qty 30, 30d supply, fill #1

## 2024-03-14 ENCOUNTER — Other Ambulatory Visit: Payer: Self-pay

## 2024-03-14 MED ORDER — DEXCOM G7 RECEIVER DEVI
1.0000 | 0 refills | Status: AC
  Filled 2024-03-14 – 2024-03-24 (×2): qty 1, 90d supply, fill #0

## 2024-03-15 ENCOUNTER — Other Ambulatory Visit: Payer: Self-pay

## 2024-03-16 ENCOUNTER — Other Ambulatory Visit: Payer: Self-pay

## 2024-03-17 ENCOUNTER — Other Ambulatory Visit: Payer: Self-pay

## 2024-03-20 ENCOUNTER — Other Ambulatory Visit: Payer: Self-pay

## 2024-03-20 MED ORDER — DEXCOM G7 RECEIVER DEVI
0 refills | Status: DC
  Filled 2024-03-20: qty 1, 90d supply, fill #0

## 2024-03-21 ENCOUNTER — Other Ambulatory Visit: Payer: Self-pay

## 2024-03-21 MED ORDER — HYDROCHLOROTHIAZIDE 25 MG PO TABS
25.0000 mg | ORAL_TABLET | Freq: Every day | ORAL | 11 refills | Status: DC
  Filled 2024-03-21: qty 30, 30d supply, fill #0

## 2024-03-21 MED ORDER — ETODOLAC 400 MG PO TABS
400.0000 mg | ORAL_TABLET | Freq: Two times a day (BID) | ORAL | 3 refills | Status: DC
  Filled 2024-03-21: qty 180, 90d supply, fill #0
  Filled 2024-07-21: qty 120, 60d supply, fill #1
  Filled 2024-08-03: qty 180, 90d supply, fill #1

## 2024-03-24 ENCOUNTER — Other Ambulatory Visit: Payer: Self-pay

## 2024-03-24 MED ORDER — DEXCOM G7 SENSOR MISC
1.0000 | 0 refills | Status: DC
  Filled 2024-03-24 – 2024-07-06 (×3): qty 9, 90d supply, fill #0

## 2024-03-24 MED ORDER — VENLAFAXINE HCL ER 150 MG PO CP24
150.0000 mg | ORAL_CAPSULE | Freq: Every day | ORAL | 3 refills | Status: AC
  Filled 2024-03-24 – 2024-07-01 (×2): qty 90, 90d supply, fill #0
  Filled 2024-07-05: qty 60, 60d supply, fill #0
  Filled 2024-08-21 – 2024-09-01 (×2): qty 60, 60d supply, fill #1
  Filled 2024-10-29: qty 60, 60d supply, fill #2

## 2024-03-26 ENCOUNTER — Other Ambulatory Visit: Payer: Self-pay

## 2024-03-26 MED ORDER — PHENTERMINE HCL 30 MG PO CAPS
30.0000 mg | ORAL_CAPSULE | ORAL | 2 refills | Status: DC
  Filled 2024-03-26: qty 15, 30d supply, fill #0

## 2024-03-27 ENCOUNTER — Other Ambulatory Visit: Payer: Self-pay

## 2024-03-31 ENCOUNTER — Emergency Department: Payer: Self-pay

## 2024-03-31 ENCOUNTER — Emergency Department
Admission: EM | Admit: 2024-03-31 | Discharge: 2024-03-31 | Disposition: A | Discharge status: Home / Self Care | Payer: Self-pay | Attending: Emergency Medicine | Admitting: Emergency Medicine

## 2024-03-31 ENCOUNTER — Other Ambulatory Visit: Payer: Self-pay

## 2024-03-31 DIAGNOSIS — E119 Type 2 diabetes mellitus without complications: Secondary | ICD-10-CM | POA: Insufficient documentation

## 2024-03-31 DIAGNOSIS — R0789 Other chest pain: Secondary | ICD-10-CM | POA: Insufficient documentation

## 2024-03-31 DIAGNOSIS — I10 Essential (primary) hypertension: Secondary | ICD-10-CM | POA: Insufficient documentation

## 2024-03-31 DIAGNOSIS — Z79899 Other long term (current) drug therapy: Secondary | ICD-10-CM | POA: Insufficient documentation

## 2024-03-31 DIAGNOSIS — J45909 Unspecified asthma, uncomplicated: Secondary | ICD-10-CM | POA: Diagnosis not present

## 2024-03-31 LAB — TROPONIN I (HIGH SENSITIVITY): Troponin I (High Sensitivity): 6 ng/L (ref <18)

## 2024-03-31 LAB — CBC
HCT: 43.4 % (ref 36.0–46.0)
Hemoglobin: 14.2 g/dL (ref 12.0–15.0)
MCH: 29.5 pg (ref 26.0–34.0)
MCHC: 32.7 g/dL (ref 30.0–36.0)
MCV: 90.2 fL (ref 80.0–100.0)
Platelets: 326 10*3/uL (ref 150–400)
RBC: 4.81 MIL/uL (ref 3.87–5.11)
RDW: 12.7 % (ref 11.5–15.5)
WBC: 7.3 10*3/uL (ref 4.0–10.5)
nRBC: 0 % (ref 0.0–0.2)

## 2024-03-31 LAB — BASIC METABOLIC PANEL WITH GFR
Anion gap: 9 (ref 5–15)
BUN: 26 mg/dL — ABNORMAL HIGH (ref 6–20)
CO2: 28 mmol/L (ref 22–32)
Calcium: 9.7 mg/dL (ref 8.9–10.3)
Chloride: 103 mmol/L (ref 98–111)
Creatinine, Ser: 0.5 mg/dL (ref 0.44–1.00)
GFR, Estimated: >60 mL/min (ref >60)
Glucose, Bld: 106 mg/dL — ABNORMAL HIGH (ref 70–99)
Potassium: 3.4 mmol/L — ABNORMAL LOW (ref 3.5–5.1)
Sodium: 140 mmol/L (ref 135–145)

## 2024-03-31 NOTE — ED Triage Notes (Signed)
 Arrives from [facility] for ED evaluation for intermittent CP/HTN x 1 week.  BP's at home running 170/105.  Takes hydrochlorothiazide  for HTN. Patient currently AAOx3.  Skin warm and dry. No SOB/ DOE

## 2024-03-31 NOTE — ED Provider Notes (Signed)
 [facility] Provider Note    Event Date/Time   First MD Initiated Contact with Patient 03/31/24 1426     (approximate)   History   Chest Pain   HPI  Marissa Baldwin is a 58 y.o. female with a history of hypertension, diabetes, asthma who presents with intermittent chest discomfort, elevated blood pressure for nearly a week.  She does take hydrochlorothiazide  for hypertension.  Currently denies chest pain to me.  No shortness of breath.  No lower extremity swelling or edema.  She assumed that chest discomfort is related to stress     Physical Exam   Triage Vital Signs: ED Triage Vitals  Encounter Vitals Group     BP 03/31/24 1406 (!) 144/100     Girls Systolic BP Percentile --      Girls Diastolic BP Percentile --      Boys Systolic BP Percentile --      Boys Diastolic BP Percentile --      Pulse Rate 03/31/24 1406 76     Resp 03/31/24 1406 17     Temp 03/31/24 1406 98.2 F (36.8 C)     Temp Source 03/31/24 1406 Oral     SpO2 03/31/24 1406 100 %     Weight 03/31/24 1403 71.3 kg (157 lb 3 oz)     Height --      Head Circumference --      Peak Flow --      Pain Score 03/31/24 1403 5     Pain Loc --      Pain Education --      Exclude from Growth Chart --     Most recent vital signs: Vitals:   03/31/24 1500 03/31/24 1530  BP: (!) 141/90 (!) 146/98  Pulse: 62 69  Resp: 12 17  Temp:    SpO2: 96% 98%     General: Awake, no distress.  CV:  Good peripheral perfusion.  Regular rate and rhythm Resp:  Normal effort.  CTA bilaterally Abd:  No distention.  Other:  No calf pain or swelling   ED Results / Procedures / Treatments   Labs (all labs ordered are listed, but only abnormal results are displayed) Labs Reviewed  BASIC METABOLIC PANEL WITH GFR - Abnormal; Notable for the following components:      Result Value   Potassium 3.4 (*)    Glucose, Bld 106 (*)    BUN 26 (*)    All other components within normal limits  CBC  TROPONIN I  (HIGH SENSITIVITY)     EKG  ED ECG REPORT I, Bryson Carbine, the attending physician, personally viewed and interpreted this ECG.  Date: 03/31/2024  Rhythm: normal sinus rhythm QRS Axis: normal Intervals: normal ST/T Wave abnormalities: normal Narrative Interpretation: no evidence of acute ischemia    RADIOLOGY     PROCEDURES:  Critical Care performed:   Procedures   MEDICATIONS ORDERED IN ED: Medications - No data to display   IMPRESSION / MDM / ASSESSMENT AND PLAN / ED COURSE  I reviewed the triage vital signs and the nursing notes. Patient's presentation is most consistent with acute presentation with potential threat to life or bodily function.  Patient presents with chest discomfort,, hypertension as detailed above, overall well-appearing today, EKG is reassuring, pending high sensitive troponin, lab work  She is mildly hypertensive here.  High sensitive troponin is normal.  CBC, BMP are reassuring.  Chest x-ray is normal  Not consistent with ACS,  considered admission however patient is asymptomatic feeling well blood pressure has improved without therapy.  She will follow-up with her PCP for blood pressure medication adjustment, strict return precautions, patient agrees with this plan        FINAL CLINICAL IMPRESSION(S) / ED DIAGNOSES   Final diagnoses:  Atypical chest pain  Hypertension, unspecified type     Rx / DC Orders   ED Discharge Orders          Ordered    Ambulatory referral to Cardiology       Comments: If you have not heard from the Cardiology office within the next 72 hours please call [phone].   03/31/24 1527             Note:  This document was prepared using Dragon voice recognition software and may include unintentional dictation errors.   Bryson Carbine, MD 04/03/24 0700

## 2024-04-03 ENCOUNTER — Other Ambulatory Visit: Payer: Self-pay

## 2024-04-03 MED ORDER — LOSARTAN POTASSIUM-HCTZ 50-12.5 MG PO TABS
1.0000 | ORAL_TABLET | Freq: Every day | ORAL | 11 refills | Status: AC
  Filled 2024-04-03: qty 30, 30d supply, fill #0
  Filled 2024-04-25: qty 30, 30d supply, fill #1
  Filled 2024-05-11 – 2024-06-05 (×2): qty 30, 30d supply, fill #2
  Filled 2024-07-01: qty 30, 30d supply, fill #3
  Filled 2024-08-03: qty 30, 30d supply, fill #4
  Filled 2024-08-21 – 2024-09-05 (×2): qty 30, 30d supply, fill #5
  Filled 2024-09-30: qty 30, 30d supply, fill #6
  Filled 2024-10-25 – 2024-10-28 (×2): qty 30, 30d supply, fill #7

## 2024-04-25 ENCOUNTER — Other Ambulatory Visit: Payer: Self-pay

## 2024-05-04 ENCOUNTER — Other Ambulatory Visit: Payer: Self-pay

## 2024-05-10 ENCOUNTER — Other Ambulatory Visit: Payer: Self-pay

## 2024-05-12 ENCOUNTER — Other Ambulatory Visit: Payer: Self-pay

## 2024-05-18 ENCOUNTER — Other Ambulatory Visit: Payer: Self-pay

## 2024-05-30 ENCOUNTER — Other Ambulatory Visit: Payer: Self-pay

## 2024-05-30 MED ORDER — NEOMYCIN-POLYMYXIN-HC 3.5-10000-1 OT SUSP
4.0000 [drp] | Freq: Four times a day (QID) | OTIC | 0 refills | Status: DC
  Filled 2024-05-30 – 2024-06-01 (×2): qty 10, 13d supply, fill #0

## 2024-05-31 ENCOUNTER — Other Ambulatory Visit: Payer: Self-pay

## 2024-06-01 ENCOUNTER — Other Ambulatory Visit: Payer: Self-pay

## 2024-06-02 ENCOUNTER — Other Ambulatory Visit: Payer: Self-pay

## 2024-06-02 MED ORDER — CIPROFLOXACIN-DEXAMETHASONE 0.3-0.1 % OT SUSP
4.0000 [drp] | Freq: Two times a day (BID) | OTIC | 0 refills | Status: AC
  Filled 2024-06-02: qty 7.5, 19d supply, fill #0

## 2024-06-05 ENCOUNTER — Other Ambulatory Visit: Payer: Self-pay

## 2024-06-07 ENCOUNTER — Other Ambulatory Visit: Payer: Self-pay

## 2024-06-08 ENCOUNTER — Other Ambulatory Visit: Payer: Self-pay

## 2024-06-08 MED ORDER — BUSPIRONE HCL 10 MG PO TABS
10.0000 mg | ORAL_TABLET | Freq: Every day | ORAL | 3 refills | Status: AC
  Filled 2024-06-08 – 2024-10-29 (×2): qty 90, 90d supply, fill #0

## 2024-06-09 ENCOUNTER — Other Ambulatory Visit: Payer: Self-pay

## 2024-06-20 ENCOUNTER — Other Ambulatory Visit: Payer: Self-pay

## 2024-06-27 ENCOUNTER — Other Ambulatory Visit: Payer: Self-pay

## 2024-07-02 ENCOUNTER — Other Ambulatory Visit: Payer: Self-pay

## 2024-07-03 ENCOUNTER — Other Ambulatory Visit: Payer: Self-pay

## 2024-07-04 ENCOUNTER — Other Ambulatory Visit: Payer: Self-pay

## 2024-07-05 ENCOUNTER — Other Ambulatory Visit: Payer: Self-pay

## 2024-07-06 ENCOUNTER — Other Ambulatory Visit: Payer: Self-pay

## 2024-07-21 ENCOUNTER — Other Ambulatory Visit: Payer: Self-pay

## 2024-07-26 ENCOUNTER — Other Ambulatory Visit: Payer: Self-pay

## 2024-08-01 ENCOUNTER — Other Ambulatory Visit: Payer: Self-pay

## 2024-08-03 ENCOUNTER — Other Ambulatory Visit: Payer: Self-pay

## 2024-08-07 ENCOUNTER — Other Ambulatory Visit: Payer: Self-pay

## 2024-08-18 ENCOUNTER — Other Ambulatory Visit: Payer: Self-pay

## 2024-08-21 ENCOUNTER — Other Ambulatory Visit: Payer: Self-pay

## 2024-08-22 ENCOUNTER — Other Ambulatory Visit: Payer: Self-pay

## 2024-08-22 MED ORDER — PHENTERMINE HCL 30 MG PO CAPS
30.0000 mg | ORAL_CAPSULE | ORAL | 2 refills | Status: AC
  Filled 2024-08-22: qty 15, 30d supply, fill #0

## 2024-08-23 ENCOUNTER — Other Ambulatory Visit: Payer: Self-pay

## 2024-09-01 ENCOUNTER — Other Ambulatory Visit: Payer: Self-pay

## 2024-09-05 ENCOUNTER — Other Ambulatory Visit: Payer: Self-pay

## 2024-09-20 ENCOUNTER — Other Ambulatory Visit: Payer: Self-pay

## 2024-09-20 MED ORDER — IBUPROFEN 600 MG PO TABS
600.0000 mg | ORAL_TABLET | Freq: Two times a day (BID) | ORAL | 3 refills | Status: AC
  Filled 2024-09-20: qty 180, 90d supply, fill #0

## 2024-09-20 MED ORDER — ESTRADIOL 1 MG PO TABS
1.0000 mg | ORAL_TABLET | Freq: Every day | ORAL | 11 refills | Status: AC
  Filled 2024-09-20: qty 30, 30d supply, fill #0
  Filled 2024-10-16: qty 30, 30d supply, fill #1
  Filled 2024-11-23: qty 30, 30d supply, fill #2

## 2024-09-20 MED ORDER — PROGESTERONE 200 MG PO CAPS
200.0000 mg | ORAL_CAPSULE | Freq: Every day | ORAL | 11 refills | Status: AC
  Filled 2024-09-20: qty 30, 30d supply, fill #0
  Filled 2024-10-16: qty 30, 30d supply, fill #1
  Filled 2024-11-23: qty 30, 30d supply, fill #2

## 2024-09-30 ENCOUNTER — Other Ambulatory Visit: Payer: Self-pay

## 2024-10-01 ENCOUNTER — Other Ambulatory Visit: Payer: Self-pay

## 2024-10-02 ENCOUNTER — Other Ambulatory Visit: Payer: Self-pay

## 2024-10-04 ENCOUNTER — Other Ambulatory Visit: Payer: Self-pay

## 2024-10-05 ENCOUNTER — Other Ambulatory Visit: Payer: Self-pay

## 2024-10-25 ENCOUNTER — Other Ambulatory Visit: Payer: Self-pay

## 2024-10-29 ENCOUNTER — Other Ambulatory Visit: Payer: Self-pay

## 2024-10-30 ENCOUNTER — Other Ambulatory Visit: Payer: Self-pay

## 2024-10-31 ENCOUNTER — Other Ambulatory Visit: Payer: Self-pay

## 2024-10-31 MED ORDER — HYDROCHLOROTHIAZIDE 25 MG PO TABS
25.0000 mg | ORAL_TABLET | Freq: Every day | ORAL | 3 refills | Status: AC
  Filled 2024-10-31: qty 90, 90d supply, fill #0

## 2024-11-03 ENCOUNTER — Other Ambulatory Visit: Payer: Self-pay

## 2024-11-23 ENCOUNTER — Other Ambulatory Visit: Payer: Self-pay
